# Patient Record
Sex: Female | Born: 2000 | Hispanic: Yes | Marital: Single | Smoking: Never smoker
Health system: Southern US, Community
[De-identification: ages and names within clinical notes are randomized; demographics above are authoritative.]

## PROBLEM LIST (undated history)

## (undated) ENCOUNTER — Ambulatory Visit: Admission: EM | Payer: Medicaid Other | Source: Home / Self Care

## (undated) HISTORY — PX: NO PAST SURGERIES: SHX2092

---
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
## 2023-07-05 ENCOUNTER — Ambulatory Visit (INDEPENDENT_AMBULATORY_CARE_PROVIDER_SITE_OTHER): Payer: Medicaid Other

## 2023-07-05 VITALS — Ht 62.0 in | Wt 188.0 lb

## 2023-07-05 DIAGNOSIS — Z369 Encounter for antenatal screening, unspecified: Secondary | ICD-10-CM

## 2023-07-05 DIAGNOSIS — Z3480 Encounter for supervision of other normal pregnancy, unspecified trimester: Secondary | ICD-10-CM

## 2023-07-05 DIAGNOSIS — Z3689 Encounter for other specified antenatal screening: Secondary | ICD-10-CM

## 2023-07-05 DIAGNOSIS — Z3490 Encounter for supervision of normal pregnancy, unspecified, unspecified trimester: Secondary | ICD-10-CM | POA: Insufficient documentation

## 2023-07-05 NOTE — Progress Notes (Signed)
New OB Intake  I connected with  Carolyn King on 07/05/23 at  1:15 PM EDT by telephone  and verified that I am speaking with the correct person using two identifiers. Nurse is located at [facility] and pt is located at home.  I explained I am completing New OB Intake today. We discussed her EDD of 02/05/2024 that is based on LMP of 05/01/2023. Pt is G1/P0. I reviewed her allergies, medications, Medical/Surgical/OB history, and appropriate screenings. There are cats in the home. Another family member changes the litter box. Based on history, this is a/an pregnancy uncomplicated .   Patient Active Problem List   Diagnosis Date Noted   Supervision of other normal pregnancy, antepartum 07/05/2023    Concerns addressed today None  Delivery Plans:  Plans to deliver at [facility].  Pt aware [facility] is the only hosp our providers deliver at.  Anatomy US Explained first scheduled [place] will be scheduled soon and an anatomy scan will be done at 20 weeks.  Labs Discussed genetic screening with patient. Patient declines genetic testing to be drawn at new OB visit. Discussed possible labs to be drawn at new OB appointment.  COVID Vaccine Patient has had COVID vaccine.   Social Determinants of Health Food Insecurity: denies food insecurity Transportation: Patient denies transportation needs.  First visit review I reviewed new OB appt with pt. I explained she will have ob bloodwork and pap smear/pelvic exam if indicated. Explained pt will be seen by an AOB provider at first visit; encounter routed to appropriate provider.   Loran Senters, [place] 07/05/2023  1:38 PM

## 2023-07-05 NOTE — Patient Instructions (Signed)
Second Trimester of Pregnancy  The second trimester of pregnancy is from week 13 through week 27. This is months 4 through 6 of pregnancy. The second trimester is often a time when you feel your best. Your body has adjusted to being pregnant, and you begin to feel better physically. During the second trimester: Morning sickness has lessened or stopped completely. You may have more energy. You may have an increase in appetite. The second trimester is also a time when the unborn baby (fetus) is growing rapidly. At the end of the sixth month, the fetus may be up to 12 inches long and weigh about 1 pounds. You will likely begin to feel the baby move (quickening) between 16 and 20 weeks of pregnancy. Body changes during your second trimester Your body continues to go through many changes during your second trimester. The changes vary and generally return to normal after the baby is born. Physical changes Your weight will continue to increase. You will notice your lower abdomen bulging out. You may begin to get stretch marks on your hips, abdomen, and breasts. Your breasts will continue to grow and to become tender. Dark spots or blotches (chloasma or mask of pregnancy) may develop on your face. A dark line from your belly button to the pubic area (linea nigra) may appear. You may have changes in your hair. These can include thickening of your hair, rapid growth, and changes in texture. Some people also have hair loss during or after pregnancy, or hair that feels dry or thin. Health changes You may develop headaches. You may have heartburn. You may develop constipation. You may develop hemorrhoids or swollen, bulging veins (varicose veins). Your gums may bleed and may be sensitive to brushing and flossing. You may urinate more often because the fetus is pressing on your bladder. You may have back pain. This is caused by: Weight gain. Pregnancy hormones that are relaxing the joints in your  pelvis. A shift in weight and the muscles that support your balance. Follow these instructions at home: Medicines Follow your health care provider's instructions regarding medicine use. Specific medicines may be either safe or unsafe to take during pregnancy. Do not take any medicines unless approved by your health care provider. Take a prenatal vitamin that contains at least 600 micrograms (mcg) of folic acid. Eating and drinking Eat a healthy diet that includes fresh fruits and vegetables, whole grains, good sources of protein such as meat, eggs, or tofu, and low-fat dairy products. Avoid raw meat and unpasteurized juice, milk, and cheese. These carry germs that can harm you and your baby. You may need to take these actions to prevent or treat constipation: Drink enough fluid to keep your urine pale yellow. Eat foods that are high in fiber, such as beans, whole grains, and fresh fruits and vegetables. Limit foods that are high in fat and processed sugars, such as fried or sweet foods. Activity Exercise only as directed by your health care provider. Most people can continue their usual exercise routine during pregnancy. Try to exercise for 30 minutes at least 5 days a week. Stop exercising if you develop contractions in your uterus. Stop exercising if you develop pain or cramping in the lower abdomen or lower back. Avoid exercising if it is very hot or humid or if you are at a high altitude. Avoid heavy lifting. If you choose to, you may have sex unless your health care provider tells you not to. Relieving pain and discomfort Wear a supportive   bra to prevent discomfort from breast tenderness. Take warm sitz baths to soothe any pain or discomfort caused by hemorrhoids. Use hemorrhoid cream if your health care provider approves. Rest with your legs raised (elevated) if you have leg cramps or low back pain. If you develop varicose veins: Wear support hose as told by your health care  provider. Elevate your feet for 15 minutes, 3-4 times a day. Limit salt in your diet. Safety Wear your seat belt at all times when driving or riding in a car. Talk with your health care provider if someone is verbally or physically abusive to you. Lifestyle Do not use hot tubs, steam rooms, or saunas. Do not douche. Do not use tampons or scented sanitary pads. Avoid cat litter boxes and soil used by cats. These carry germs that can cause birth defects in the baby and possibly loss of the fetus by miscarriage or stillbirth. Do not use herbal remedies, alcohol, illegal drugs, or medicines that are not approved by your health care provider. Chemicals in these products can harm your baby. Do not use any products that contain nicotine or tobacco, such as cigarettes, e-cigarettes, and chewing tobacco. If you need help quitting, ask your health care provider. General instructions During a routine prenatal visit, your health care provider will do a physical exam and other tests. He or she will also discuss your overall health. Keep all follow-up visits. This is important. Ask your health care provider for a referral to a local prenatal education class. Ask for help if you have counseling or nutritional needs during pregnancy. Your health care provider can offer advice or refer you to specialists for help with various needs. Where to find more information American Pregnancy Association: americanpregnancy.org American College of Obstetricians and Gynecologists: acog.org/en/Womens%20Health/Pregnancy Office on Women's Health: womenshealth.gov/pregnancy Contact a health care provider if you have: A headache that does not go away when you take medicine. Vision changes or you see spots in front of your eyes. Mild pelvic cramps, pelvic pressure, or nagging pain in the abdominal area. Persistent nausea, vomiting, or diarrhea. A bad-smelling vaginal discharge or foul-smelling urine. Pain when you  urinate. Sudden or extreme swelling of your face, hands, ankles, feet, or legs. A fever. Get help right away if you: Have fluid leaking from your vagina. Have spotting or bleeding from your vagina. Have severe abdominal cramping or pain. Have difficulty breathing. Have chest pain. Have fainting spells. Have not felt your baby move for the time period told by your health care provider. Have new or increased pain, swelling, or redness in an arm or leg. Summary The second trimester of pregnancy is from week 13 through week 27 (months 4 through 6). Do not use herbal remedies, alcohol, illegal drugs, or medicines that are not approved by your health care provider. Chemicals in these products can harm your baby. Exercise only as directed by your health care provider. Most people can continue their usual exercise routine during pregnancy. Keep all follow-up visits. This is important. This information is not intended to replace advice given to you by your health care provider. Make sure you discuss any questions you have with your health care provider. Document Revised: 04/06/2020 Document Reviewed: 02/11/2020 Elsevier Patient Education  2024 [employer] First Trimester of Pregnancy  The first trimester of pregnancy starts on the first day of your last menstrual period until the end of week 12. This is also called months 1 through 3 of pregnancy. Body changes during your first trimester Your   body goes through many changes during pregnancy. The changes usually return to normal after your baby is born. Physical changes You may gain or lose weight. Your breasts may grow larger and hurt. The area around your nipples may get darker. Dark spots or blotches may develop on your face. You may have changes in your hair. Health changes You may feel like you might vomit (nauseous), and you may vomit. You may have heartburn. You may have headaches. You may have trouble pooping (constipation). Your  gums may bleed. Other changes You may get tired easily. You may pee (urinate) more often. Your menstrual periods will stop. You may not feel hungry. You may want to eat certain kinds of food. You may have changes in your emotions from day to day. You may have more dreams. Follow these instructions at home: Medicines Take over-the-counter and prescription medicines only as told by your doctor. Some medicines are not safe during pregnancy. Take a prenatal vitamin that contains at least 600 micrograms (mcg) of folic acid. Eating and drinking Eat healthy meals that include: Fresh fruits and vegetables. Whole grains. Good sources of protein, such as meat, eggs, or tofu. Low-fat dairy products. Avoid raw meat and unpasteurized juice, milk, and cheese. If you feel like you may vomit, or you vomit: Eat 4 or 5 small meals a day instead of 3 large meals. Try eating a few soda crackers. Drink liquids between meals instead of during meals. You may need to take these actions to prevent or treat trouble pooping: Drink enough fluids to keep your pee (urine) pale yellow. Eat foods that are high in fiber. These include beans, whole grains, and fresh fruits and vegetables. Limit foods that are high in fat and sugar. These include fried or sweet foods. Activity Exercise only as told by your doctor. Most people can do their usual exercise routine during pregnancy. Stop exercising if you have cramps or pain in your lower belly (abdomen) or low back. Do not exercise if it is too hot or too humid, or if you are in a place of great height (high altitude). Avoid heavy lifting. If you choose to, you may have sex unless your doctor tells you not to. Relieving pain and discomfort Wear a good support bra if your breasts are sore. Rest with your legs raised (elevated) if you have leg cramps or low back pain. If you have bulging veins (varicose veins) in your legs: Wear support hose as told by your  doctor. Raise your feet for 15 minutes, 3-4 times a day. Limit salt in your food. Safety Wear your seat belt at all times when you are in a car. Talk with your doctor if someone is hurting you or yelling at you. Talk with your doctor if you are feeling sad or have thoughts of hurting yourself. Lifestyle Do not use hot tubs, steam rooms, or saunas. Do not douche. Do not use tampons or scented sanitary pads. Do not use herbal medicines, illegal drugs, or medicines that are not approved by your doctor. Do not drink alcohol. Do not smoke or use any products that contain nicotine or tobacco. If you need help quitting, ask your doctor. Avoid cat litter boxes and soil that is used by cats. These carry germs that can cause harm to the baby and can cause a loss of your baby by miscarriage or stillbirth. General instructions Keep all follow-up visits. This is important. Ask for help if you need counseling or if you need help with   nutrition. Your doctor can give you advice or tell you where to go for help. Visit your dentist. At home, brush your teeth with a soft toothbrush. Floss gently. Write down your questions. Take them to your prenatal visits. Where to find more information American Pregnancy Association: americanpregnancy.org American College of Obstetricians and Gynecologists: www.acog.org Office on Women's Health: womenshealth.gov/pregnancy Contact a doctor if: You are dizzy. You have a fever. You have mild cramps or pressure in your lower belly. You have a nagging pain in your belly area. You continue to feel like you may vomit, you vomit, or you have watery poop (diarrhea) for 24 hours or longer. You have a bad-smelling fluid coming from your vagina. You have pain when you pee. You are exposed to a disease that spreads from person to person, such as chickenpox, measles, Zika virus, HIV, or hepatitis. Get help right away if: You have spotting or bleeding from your vagina. You have  very bad belly cramping or pain. You have shortness of breath or chest pain. You have any kind of injury, such as from a fall or a car crash. You have new or increased pain, swelling, or redness in an arm or leg. Summary The first trimester of pregnancy starts on the first day of your last menstrual period until the end of week 12 (months 1 through 3). Eat 4 or 5 small meals a day instead of 3 large meals. Do not smoke or use any products that contain nicotine or tobacco. If you need help quitting, ask your doctor. Keep all follow-up visits. This information is not intended to replace advice given to you by your health care provider. Make sure you discuss any questions you have with your health care provider. Document Revised: 04/06/2020 Document Reviewed: 02/11/2020 Elsevier Patient Education  2024 [employer] Commonly Asked Questions During Pregnancy  Cats: A parasite can be excreted in cat feces.  To avoid exposure you need to have another person empty the little box.  If you must empty the litter box you will need to wear gloves.  Wash your hands after handling your cat.  This parasite can also be found in raw or undercooked meat so this should also be avoided.  Colds, Sore Throats, Flu: Please check your medication sheet to see what you can take for symptoms.  If your symptoms are unrelieved by these medications please call the office.  Dental Work: Most any dental work your dentist recommends is permitted.  X-rays should only be taken during the first trimester if absolutely necessary.  Your abdomen should be shielded with a lead apron during all x-rays.  Please notify your provider prior to receiving any x-rays.  Novocaine is fine; gas is not recommended.  If your dentist requires a note from us prior to dental work please call the office and we will provide one for you.  Exercise: Exercise is an important part of staying healthy during your pregnancy.  You may continue most exercises  you were accustomed to prior to pregnancy.  Later in your pregnancy you will most likely notice you have difficulty with activities requiring balance like riding a bicycle.  It is important that you listen to your body and avoid activities that put you at a higher risk of falling.  Adequate rest and staying well hydrated are a must!  If you have questions about the safety of specific activities ask your provider.    Exposure to Children with illness: Try to avoid obvious exposure; report any   symptoms to us when noted,  If you have chicken pos, red measles or mumps, you should be immune to these diseases.   Please do not take any vaccines while pregnant unless you have checked with your OB provider.  Fetal Movement: After 28 weeks we recommend you do "kick counts" twice daily.  Lie or sit down in a calm quiet environment and count your baby movements "kicks".  You should feel your baby at least 10 times per hour.  If you have not felt 10 kicks within the first hour get up, walk around and have something sweet to eat or drink then repeat for an additional hour.  If count remains less than 10 per hour notify your provider.  Fumigating: Follow your pest control agent's advice as to how long to stay out of your home.  Ventilate the area well before re-entering.  Hemorrhoids:   Most over-the-counter preparations can be used during pregnancy.  Check your medication to see what is safe to use.  It is important to use a stool softener or fiber in your diet and to drink lots of liquids.  If hemorrhoids seem to be getting worse please call the office.   Hot Tubs:  Hot tubs Jacuzzis and saunas are not recommended while pregnant.  These increase your internal body temperature and should be avoided.  Intercourse:  Sexual intercourse is safe during pregnancy as long as you are comfortable, unless otherwise advised by your provider.  Spotting may occur after intercourse; report any bright red bleeding that is heavier  than spotting.  Labor:  If you know that you are in labor, please go to the hospital.  If you are unsure, please call the office and let us help you decide what to do.  Lifting, straining, etc:  If your job requires heavy lifting or straining please check with your provider for any limitations.  Generally, you should not lift items heavier than that you can lift simply with your hands and arms (no back muscles)  Painting:  Paint fumes do not harm your pregnancy, but may make you ill and should be avoided if possible.  Latex or water based paints have less odor than oils.  Use adequate ventilation while painting.  Permanents & Hair Color:  Chemicals in hair dyes are not recommended as they cause increase hair dryness which can increase hair loss during pregnancy.  " Highlighting" and permanents are allowed.  Dye may be absorbed differently and permanents may not hold as well during pregnancy.  Sunbathing:  Use a sunscreen, as skin burns easily during pregnancy.  Drink plenty of fluids; avoid over heating.  Tanning Beds:  Because their possible side effects are still unknown, tanning beds are not recommended.  Ultrasound Scans:  Routine ultrasounds are performed at approximately 20 weeks.  You will be able to see your baby's general anatomy an if you would like to know the gender this can usually be determined as well.  If it is questionable when you conceived you may also receive an ultrasound early in your pregnancy for dating purposes.  Otherwise ultrasound exams are not routinely performed unless there is a medical necessity.  Although you can request a scan we ask that you pay for it when conducted because insurance does not cover " patient request" scans.  Work: If your pregnancy proceeds without complications you may work until your due date, unless your physician or employer advises otherwise.  Round Ligament Pain/Pelvic Discomfort:  Sharp, shooting pains not associated   with bleeding are  fairly common, usually occurring in the second trimester of pregnancy.  They tend to be worse when standing up or when you remain standing for long periods of time.  These are the result of pressure of certain pelvic ligaments called "round ligaments".  Rest, Tylenol and heat seem to be the most effective relief.  As the womb and fetus grow, they rise out of the pelvis and the discomfort improves.  Please notify the office if your pain seems different than that described.  It may represent a more serious condition.  Common Medications Safe in Pregnancy  Acne:      Constipation:  Benzoyl Peroxide     Colace  Clindamycin      Dulcolax Suppository  Topica Erythromycin     Fibercon  Salicylic Acid      Metamucil         Miralax AVOID:        Senakot   Accutane    Cough:  Retin-A       Cough Drops  Tetracycline      Phenergan w/ Codeine if Rx  Minocycline      Robitussin (Plain & DM)  Antibiotics:     Crabs/Lice:  Ceclor       RID  Cephalosporins    AVOID:  E-Mycins      Kwell  Keflex  Macrobid/Macrodantin   Diarrhea:  Penicillin      Kao-Pectate  Zithromax      Imodium AD         PUSH FLUIDS AVOID:       Cipro     Fever:  Tetracycline      Tylenol (Regular or Extra  Minocycline       Strength)  Levaquin      Extra Strength-Do not          Exceed 8 tabs/24 hrs Caffeine:        <200mg/day (equiv. To 1 cup of coffee or  approx. 3 12 oz sodas)         Gas: Cold/Hayfever:       Gas-X  Benadryl      Mylicon  Claritin       Phazyme  **Claritin-D        Chlor-Trimeton    Headaches:  Dimetapp      ASA-Free Excedrin  Drixoral-Non-Drowsy     Cold Compress  Mucinex (Guaifenasin)     Tylenol (Regular or Extra  Sudafed/Sudafed-12 Hour     Strength)  **Sudafed PE Pseudoephedrine   Tylenol Cold & Sinus     Vicks Vapor Rub  Zyrtec  **AVOID if Problems With Blood Pressure         Heartburn: Avoid lying down for at least 1 hour after meals  Aciphex      Maalox     Rash:  Milk of  Magnesia     Benadryl    Mylanta       1% Hydrocortisone Cream  Pepcid  Pepcid Complete   Sleep Aids:  Prevacid      Ambien   Prilosec       Benadryl  Rolaids       Chamomile Tea  Tums (Limit 4/day)     Unisom         Tylenol PM         Warm milk-add vanilla or  Hemorrhoids:       Sugar for taste  Anusol/Anusol H.C.  (RX: Analapram 2.5%)  Sugar Substitutes:    Hydrocortisone OTC     Ok in moderation  Preparation H      Tucks        Vaseline lotion applied to tissue with wiping    Herpes:     Throat:  Acyclovir      Oragel  Famvir  Valtrex     Vaccines:         Flu Shot Leg Cramps:       *Gardasil  Benadryl      Hepatitis A         Hepatitis B Nasal Spray:       Pneumovax  Saline Nasal Spray     Polio Booster         Tetanus Nausea:       Tuberculosis test or PPD  Vitamin B6 25 mg TID   AVOID:    Dramamine      *Gardasil  Emetrol       Live Poliovirus  Ginger Root 250 mg QID    MMR (measles, mumps &  High Complex Carbs @ Bedtime    rebella)  Sea Bands-Accupressure    Varicella (Chickenpox)  Unisom 1/2 tab TID     *No known complications           If received before Pain:         Known pregnancy;   Darvocet       Resume series after  Lortab        Delivery  Percocet    Yeast:   Tramadol      Femstat  Tylenol 3      Gyne-lotrimin  Ultram       Monistat  Vicodin           MISC:         All Sunscreens           Hair Coloring/highlights          Insect Repellant's          (Including DEET)         Mystic Tans  

## 2023-07-25 ENCOUNTER — Other Ambulatory Visit: Payer: Self-pay | Admitting: Certified Nurse Midwife

## 2023-07-25 ENCOUNTER — Ambulatory Visit
Admission: RE | Admit: 2023-07-25 | Discharge: 2023-07-25 | Disposition: A | Discharge status: Home / Self Care | Payer: Medicaid Other | Source: Ambulatory Visit | Attending: Certified Nurse Midwife | Admitting: Certified Nurse Midwife

## 2023-07-25 DIAGNOSIS — Z3687 Encounter for antenatal screening for uncertain dates: Secondary | ICD-10-CM | POA: Diagnosis present

## 2023-07-25 DIAGNOSIS — Z3689 Encounter for other specified antenatal screening: Secondary | ICD-10-CM | POA: Diagnosis not present

## 2023-07-25 DIAGNOSIS — Z369 Encounter for antenatal screening, unspecified: Secondary | ICD-10-CM

## 2023-07-25 DIAGNOSIS — Z3480 Encounter for supervision of other normal pregnancy, unspecified trimester: Secondary | ICD-10-CM

## 2023-07-25 DIAGNOSIS — Z3A12 12 weeks gestation of pregnancy: Secondary | ICD-10-CM | POA: Diagnosis not present

## 2023-08-07 ENCOUNTER — Ambulatory Visit (INDEPENDENT_AMBULATORY_CARE_PROVIDER_SITE_OTHER): Payer: Medicaid Other | Admitting: Certified Nurse Midwife

## 2023-08-07 ENCOUNTER — Encounter: Payer: Self-pay | Admitting: Certified Nurse Midwife

## 2023-08-07 ENCOUNTER — Other Ambulatory Visit (HOSPITAL_COMMUNITY)
Admission: RE | Admit: 2023-08-07 | Discharge: 2023-08-07 | Disposition: A | Discharge status: Home / Self Care | Payer: Medicaid Other | Source: Ambulatory Visit | Attending: Certified Nurse Midwife | Admitting: Certified Nurse Midwife

## 2023-08-07 VITALS — BP 105/69 | HR 81 | Wt 184.4 lb

## 2023-08-07 DIAGNOSIS — Z3A14 14 weeks gestation of pregnancy: Secondary | ICD-10-CM

## 2023-08-07 DIAGNOSIS — Z3482 Encounter for supervision of other normal pregnancy, second trimester: Secondary | ICD-10-CM

## 2023-08-07 DIAGNOSIS — Z113 Encounter for screening for infections with a predominantly sexual mode of transmission: Secondary | ICD-10-CM | POA: Diagnosis present

## 2023-08-07 MED ORDER — ASPIRIN 81 MG PO TBEC: 162.0000 mg | DELAYED_RELEASE_TABLET | Freq: Every day | ORAL | 12 refills | Status: DC

## 2023-08-07 NOTE — Patient Instructions (Signed)
Prenatal Care Prenatal care is health care during pregnancy. It helps you and your unborn baby (fetus) stay as healthy as possible. Prenatal care may be provided by a midwife, a family practice doctor, a Dispensing optician (nurse practitioner or physician assistant), or a childbirth and pregnancy doctor (obstetrician). How does this affect me? During pregnancy, you will be closely monitored for any new conditions that might develop. To lower your risk of pregnancy complications, you and your health care provider will talk about any underlying conditions you have. How does this affect my baby? Early and consistent prenatal care increases the chance that your baby will be healthy during pregnancy. Prenatal care lowers the risk that your baby will be: Born early (prematurely). Smaller than expected at birth (small for gestational age). What can I expect at the first prenatal care visit? Your first prenatal care visit will likely be the longest. You should schedule your first prenatal care visit as soon as you know that you are pregnant. Your first visit is a good time to talk about any questions or concerns you have about pregnancy. Medical history At your visit, you and your health care provider will talk about your medical history, including: Any past pregnancies. Your family's medical history. Medical history of the baby's father. Any long-term (chronic) health conditions you have and how you manage them. Any surgeries or procedures you have had. Any current over-the-counter or prescription medicines, herbs, or supplements that you are taking. Other factors that could pose a risk to your baby, including: Exposure to harmful chemicals or radiation at work or at home. Any substance use, including tobacco, alcohol, and drug use. Your home setting and your stress levels, including: Exposure to abuse or violence. Household financial strain. Your daily health habits, including diet and  exercise. Tests and screenings Your health care provider will: Measure your weight, height, and blood pressure. Do a physical exam, including a pelvic and breast exam. Perform blood tests and urine tests to check for: Urinary tract infection. Sexually transmitted infections (STIs). Low iron levels in your blood (anemia). Blood type and certain proteins on red blood cells (Rh antibodies). Infections and immunity to viruses, such as hepatitis B and rubella. HIV (human immunodeficiency virus). Discuss your options for genetic screening. Tips about staying healthy Your health care provider will also give you information about how to keep yourself and your baby healthy, including: Nutrition and taking vitamins. Physical activity. How to manage pregnancy symptoms such as nausea and vomiting (morning sickness). Infections and substances that may be harmful to your baby and how to avoid them. Food safety. Dental care. Working. Travel. Warning signs to watch for and when to call your health care provider. How often will I have prenatal care visits? After your first prenatal care visit, you will have regular visits throughout your pregnancy. The visit schedule is often as follows: Up to week 28 of pregnancy: once every 4 weeks. 28-36 weeks: once every 2 weeks. After 36 weeks: every week until delivery. Some women may have visits more or less often depending on any underlying health conditions and the health of the baby. Keep all follow-up and prenatal care visits. This is important. What happens during routine prenatal care visits? Your health care provider will: Measure your weight and blood pressure. Check for fetal heart sounds. Measure the height of your uterus in your abdomen (fundal height). This may be measured starting around week 20 of pregnancy. Check the position of your baby inside your uterus. Ask questions  about your diet, sleeping patterns, and whether you can feel the baby  move. Review warning signs to watch for and signs of labor. Ask about any pregnancy symptoms you are having and how you are dealing with them. Symptoms may include: Headaches. Nausea and vomiting. Vaginal discharge. Swelling. Fatigue. Constipation. Changes in your vision. Feeling persistently sad or anxious. Any discomfort, including back or pelvic pain. Bleeding or spotting. Make a list of questions to ask your health care provider at your routine visits. What tests might I have during prenatal care visits? You may have blood, urine, and imaging tests throughout your pregnancy, such as: Urine tests to check for glucose, protein, or signs of infection. Glucose tests to check for a form of diabetes that can develop during pregnancy (gestational diabetes mellitus). This is usually done around week 24 of pregnancy. Ultrasounds to check your baby's growth and development, to check for birth defects, and to check your baby's well-being. These can also help to decide when you should deliver your baby. A test to check for group B strep (GBS) infection. This is usually done around week 36 of pregnancy. Genetic testing. This may include blood, fluid, or tissue sampling, or imaging tests, such as an ultrasound. Some genetic tests are done during the first trimester and some are done during the second trimester. What else can I expect during prenatal care visits? Your health care provider may recommend getting certain vaccines during pregnancy. These may include: A yearly flu shot (annual influenza vaccine). This is especially important if you will be pregnant during flu season. Tdap (tetanus, diphtheria, pertussis) vaccine. Getting this vaccine during pregnancy can protect your baby from whooping cough (pertussis) after birth. This vaccine may be recommended between weeks 27 and 36 of pregnancy. A COVID-19 vaccine. Later in your pregnancy, your health care provider may give you information  about: Childbirth and breastfeeding classes. Choosing a health care provider for your baby. Umbilical cord banking. Breastfeeding. Birth control after your baby is born. The hospital labor and delivery unit and how to set up a tour. Registering at the hospital before you go into labor. Where to find more information Office on Women's Health: TravelLesson.ca American Pregnancy Association: americanpregnancy.org March of Dimes: marchofdimes.org Summary Prenatal care helps you and your baby stay as healthy as possible during pregnancy. Your first prenatal care visit will most likely be the longest. You will have visits and tests throughout your pregnancy to monitor your health and your baby's health. Bring a list of questions to your visits to ask your health care provider. Make sure to keep all follow-up and prenatal care visits. This information is not intended to replace advice given to you by your health care provider. Make sure you discuss any questions you have with your health care provider. Document Revised: 08/11/2020 Document Reviewed: 08/11/2020 Elsevier Patient Education  2024 [employer].

## 2023-08-07 NOTE — Progress Notes (Signed)
NEW OB HISTORY AND PHYSICAL  SUBJECTIVE:       Carolyn King is a 22 y.o. G1P0 female, Patient's last menstrual period was 05/01/2023 (exact date)., Estimated Date of Delivery: 02/05/24, [redacted]w[redacted]d, presents today for establishment of Prenatal Care. She has no unusual complaints   Body mass index is 33.73 kg/m. , Hispanic , nulliparity : Asprin 162 mg ordered    Relationship: Married  Living: with spouse and step child Work : Production assistant, radio at [employer]  Exercise: lots of walking at work  Alcohol/drugs/smoking/vaping:  denies use    Gynecologic History Patient's last menstrual period was 05/01/2023 (exact date). Normal Contraception: none Last Pap: n/a .   Obstetric History OB History  Gravida Para Term Preterm AB Living  1            SAB IAB Ectopic Multiple Live Births               # Outcome Date GA Lbr Len/2nd Weight Sex Type Anes PTL Lv  1 Current             No past medical history on file.  No past surgical history on file.  Current Outpatient Medications on File Prior to Visit  Medication Sig Dispense Refill   Prenatal 28-0.8 MG TABS Take 1 tablet by mouth every morning.     No current facility-administered medications on file prior to visit.    No Known Allergies  Social History   Socioeconomic History   Marital status: Single    Spouse name: Not on file   Number of children: 0   Years of education: 12   Highest education level: Not on file  Occupational History   Occupation: works at a clinic  Tobacco Use   Smoking status: Never   Smokeless tobacco: Never  Vaping Use   Vaping status: Never Used  Substance and Sexual Activity   Alcohol use: Never   Drug use: Never   Sexual activity: Yes    Partners: Male    Birth control/protection: None  Other Topics Concern   Not on file  Social History Narrative   Not on file   Social Determinants of Health   Financial Resource Strain: Low Risk  (07/05/2023)   Overall Financial Resource Strain (CARDIA)     Difficulty of Paying Living Expenses: Not hard at all  Food Insecurity: No Food Insecurity (07/05/2023)   Hunger Vital Sign    Worried About Running Out of Food in the Last Year: Never true    Ran Out of Food in the Last Year: Never true  Transportation Needs: No Transportation Needs (07/05/2023)   PRAPARE - Administrator, Civil Service (Medical): No    Lack of Transportation (Non-Medical): No  Physical Activity: Insufficiently Active (07/05/2023)   Exercise Vital Sign    Days of Exercise per Week: 1 day    Minutes of Exercise per Session: 30 min  Stress: No Stress Concern Present (07/05/2023)   [employer] of Occupational Health - Occupational Stress Questionnaire    Feeling of Stress : Not at all  Social Connections: Unknown (07/05/2023)   Social Connection and Isolation Panel [NHANES]    Frequency of Communication with Friends and Family: More than three times a week    Frequency of Social Gatherings with Friends and Family: More than three times a week    Attends Religious Services: Never    Database administrator or Organizations: No    Attends Banker  Meetings: Never    Marital Status: Not on file  Intimate Partner Violence: Not At Risk (07/05/2023)   Humiliation, Afraid, Rape, and Kick questionnaire    Fear of Current or Ex-Partner: No    Emotionally Abused: No    Physically Abused: No    Sexually Abused: No    Family History  Problem Relation Age of Onset   Healthy Mother    Healthy Father    Healthy Sister    Healthy Brother    Diabetes Maternal Grandmother    Healthy Maternal Grandfather    Healthy Paternal Grandmother    Healthy Paternal Grandfather     The following portions of the patient's history were reviewed and updated as appropriate: allergies, current medications, past OB history, past medical history, past surgical history, past family history, past social history, and problem list.    OBJECTIVE: Initial Physical Exam  (New OB)  GENERAL APPEARANCE: alert, well appearing, in no apparent distress, oriented to person, place and time, overweight HEAD: normocephalic, atraumatic MOUTH: mucous membranes moist, pharynx normal without lesions THYROID: no thyromegaly or masses present BREASTS: no masses noted, no significant tenderness, no palpable axillary nodes, no skin changes LUNGS: clear to auscultation, no wheezes, rales or rhonchi, symmetric air entry HEART: regular rate and rhythm, no murmurs ABDOMEN: soft, nontender, nondistended, no abnormal masses, no epigastric pain, obese, and FHT present EXTREMITIES: no redness or tenderness in the calves or thighs, no edema, no limitation in range of motion, intact peripheral pulses SKIN: normal coloration and turgor, no rashes LYMPH NODES: no adenopathy palpable NEUROLOGIC: alert, oriented, normal speech, no focal findings or movement disorder noted  PELVIC EXAM deferred pt declines , pap not indicated . Pt self swab.   ASSESSMENT: Normal pregnancy  PLAN: Prenatal care See ordersNew OB counseling: The patient has been given an overview regarding routine prenatal care. Recommendations regarding diet, weight gain  , and exercise in pregnancy were given. Prenatal testing, optional genetic testing, carrier screening, and ultrasound use in pregnancy were reviewed.  Benefits of Breast Feeding were discussed. The patient is encouraged to consider nursing her baby post partum.   Doreene Burke, CNM

## 2023-08-07 NOTE — Addendum Note (Signed)
Addended by: Fonda Kinder on: 08/07/2023 03:50 PM   Modules accepted: Orders

## 2023-08-08 LAB — URINALYSIS, ROUTINE W REFLEX MICROSCOPIC
Bilirubin, UA: NEGATIVE
Glucose, UA: NEGATIVE
Leukocytes,UA: NEGATIVE
Nitrite, UA: NEGATIVE
RBC, UA: NEGATIVE
Specific Gravity, UA: >=1.03 — AB (ref 1.005–1.030)
Urobilinogen, Ur: 1 mg/dL (ref 0.2–1.0)
pH, UA: 6 (ref 5.0–7.5)

## 2023-08-08 LAB — CERVICOVAGINAL ANCILLARY ONLY
Chlamydia: NEGATIVE
Comment: NEGATIVE
Comment: NORMAL
Neisseria Gonorrhea: NEGATIVE

## 2023-08-08 LAB — MICROSCOPIC EXAMINATION
Casts: NONE SEEN /lpf
Epithelial Cells (non renal): >10 /hpf — AB (ref 0–10)
RBC, Urine: NONE SEEN /hpf (ref 0–2)

## 2023-08-08 LAB — CBC/D/PLT+RPR+RH+ABO+RUBIGG...
Antibody Screen: NEGATIVE
Basophils Absolute: 0.1 10*3/uL (ref 0.0–0.2)
Basos: 1 %
EOS (ABSOLUTE): 0.7 10*3/uL — ABNORMAL HIGH (ref 0.0–0.4)
Eos: 6 %
HCV Ab: NONREACTIVE
HIV Screen 4th Generation wRfx: NONREACTIVE
Hematocrit: 35.6 % (ref 34.0–46.6)
Hemoglobin: 11.5 g/dL (ref 11.1–15.9)
Hepatitis B Surface Ag: NEGATIVE
Immature Grans (Abs): 0 10*3/uL (ref 0.0–0.1)
Immature Granulocytes: 0 %
Lymphocytes Absolute: 2.1 10*3/uL (ref 0.7–3.1)
Lymphs: 19 %
MCH: 29.6 pg (ref 26.6–33.0)
MCHC: 32.3 g/dL (ref 31.5–35.7)
MCV: 92 fL (ref 79–97)
Monocytes Absolute: 0.6 10*3/uL (ref 0.1–0.9)
Monocytes: 5 %
Neutrophils Absolute: 7.4 10*3/uL — ABNORMAL HIGH (ref 1.4–7.0)
Neutrophils: 69 %
Platelets: 338 10*3/uL (ref 150–450)
RBC: 3.88 x10E6/uL (ref 3.77–5.28)
RDW: 12.5 % (ref 11.7–15.4)
RPR Ser Ql: NONREACTIVE
Rh Factor: POSITIVE
Rubella Antibodies, IGG: 8.03 index (ref >0.99)
Varicella zoster IgG: REACTIVE
WBC: 10.8 10*3/uL (ref 3.4–10.8)

## 2023-08-08 LAB — HCV INTERPRETATION

## 2023-08-09 LAB — MONITOR DRUG PROFILE 14(MW)
Amphetamine Scrn, Ur: NEGATIVE ng/mL
BARBITURATE SCREEN URINE: NEGATIVE ng/mL
BENZODIAZEPINE SCREEN, URINE: NEGATIVE ng/mL
Buprenorphine, Urine: NEGATIVE ng/mL
CANNABINOIDS UR QL SCN: NEGATIVE ng/mL
Cocaine (Metab) Scrn, Ur: NEGATIVE ng/mL
Creatinine(Crt), U: 327.2 mg/dL — ABNORMAL HIGH (ref 20.0–300.0)
Fentanyl, Urine: NEGATIVE pg/mL
Meperidine Screen, Urine: NEGATIVE ng/mL
Methadone Screen, Urine: NEGATIVE ng/mL
OXYCODONE+OXYMORPHONE UR QL SCN: NEGATIVE ng/mL
Opiate Scrn, Ur: NEGATIVE ng/mL
Ph of Urine: 5.8 (ref 4.5–8.9)
Phencyclidine Qn, Ur: NEGATIVE ng/mL
Propoxyphene Scrn, Ur: NEGATIVE ng/mL
SPECIFIC GRAVITY: 1.021
Tramadol Screen, Urine: NEGATIVE ng/mL

## 2023-08-09 LAB — NICOTINE SCREEN, URINE: Cotinine Ql Scrn, Ur: NEGATIVE ng/mL

## 2023-08-12 ENCOUNTER — Telehealth: Payer: Self-pay

## 2023-08-12 LAB — MATERNIT 21 PLUS CORE, BLOOD
Fetal Fraction: 19
Result (T21): NEGATIVE
Trisomy 13 (Patau syndrome): NEGATIVE
Trisomy 18 (Edwards syndrome): NEGATIVE
Trisomy 21 (Down syndrome): NEGATIVE

## 2023-08-12 NOTE — Telephone Encounter (Signed)
Carolyn King called triage wanting to know the gender of her baby, I advised her it was a Female

## 2023-09-04 ENCOUNTER — Encounter: Payer: Medicaid Other | Admitting: Obstetrics and Gynecology

## 2023-09-05 ENCOUNTER — Ambulatory Visit (INDEPENDENT_AMBULATORY_CARE_PROVIDER_SITE_OTHER): Payer: Medicaid Other

## 2023-09-05 VITALS — BP 100/66 | HR 64 | Wt 188.0 lb

## 2023-09-05 DIAGNOSIS — Z3A18 18 weeks gestation of pregnancy: Secondary | ICD-10-CM

## 2023-09-05 DIAGNOSIS — Z3480 Encounter for supervision of other normal pregnancy, unspecified trimester: Secondary | ICD-10-CM

## 2023-09-05 NOTE — Assessment & Plan Note (Addendum)
-   Declined flu vaccine.  - Discussed need to schedule anatomy US in next 1-2 weeks. - Reviewed low suspicion for amniotic fluid given small gush and no ongoing leakage. Reviewed precautions for when to go to L&D triage.  - Reviewed red flag warning signs anticipatory guidance for upcoming prenatal care.

## 2023-09-05 NOTE — Progress Notes (Signed)
    Return Prenatal Note   Assessment/Plan   Plan  22 y.o. G1P0 at [redacted]w[redacted]d presents for follow-up OB visit. Reviewed prenatal record including previous visit note.  Supervision of other normal pregnancy, antepartum - Declined flu vaccine.  - Discussed need to schedule anatomy US in next 1-2 weeks. - Reviewed low suspicion for amniotic fluid given small gush and no ongoing leakage. Reviewed precautions for when to go to L&D triage.  - Reviewed red flag warning signs anticipatory guidance for upcoming prenatal care.    No orders of the defined types were placed in this encounter.  Return in about 4 weeks (around 10/03/2023) for ROB.   Future Appointments  Date Time Provider Department Center  10/03/2023 11:15 AM Doreene Burke, CNM [facility] None    For next visit:  continue with routine prenatal care     Subjective   22 y.o. G1P0 at [redacted]w[redacted]d presents for this follow-up prenatal visit.  Patient had small gush of clear fluid about two days ago but has not had any additional fluid leaking since that time. Unsure if it was urine although she didn't feel like she needed to pee. Patient reports: Movement: Present Contractions: Not present  Objective   Flow sheet Vitals: Pulse Rate: 64 BP: 100/66 Fundal Height: 18 cm Fetal Heart Rate (bpm): 145 Total weight gain: 10 lb (4.536 kg)  General Appearance  No acute distress, well appearing, and well nourished Pulmonary   Normal work of breathing Neurologic   Alert and oriented to person, place, and time Psychiatric   Mood and affect within normal limits  Carolyn King, CNM  09/04/2410:31 AM

## 2023-09-18 ENCOUNTER — Ambulatory Visit
Admission: RE | Admit: 2023-09-18 | Discharge: 2023-09-18 | Disposition: A | Discharge status: Home / Self Care | Payer: Medicaid Other | Source: Ambulatory Visit | Attending: Certified Nurse Midwife | Admitting: Certified Nurse Midwife

## 2023-09-18 DIAGNOSIS — Z3689 Encounter for other specified antenatal screening: Secondary | ICD-10-CM | POA: Diagnosis present

## 2023-09-18 DIAGNOSIS — Z3A19 19 weeks gestation of pregnancy: Secondary | ICD-10-CM | POA: Insufficient documentation

## 2023-09-18 DIAGNOSIS — Z3A14 14 weeks gestation of pregnancy: Secondary | ICD-10-CM

## 2023-09-30 ENCOUNTER — Other Ambulatory Visit: Payer: Self-pay | Admitting: Certified Nurse Midwife

## 2023-09-30 DIAGNOSIS — Z3A21 21 weeks gestation of pregnancy: Secondary | ICD-10-CM

## 2023-10-02 NOTE — Progress Notes (Unsigned)
       Routine Prenatal Care Visit  Subjective  Carolyn King is a 22 y.o. G1P0 at [redacted]w[redacted]d being seen today for ongoing prenatal care.  She is currently monitored for the following issues for this {Blank single:19197::"high-risk","low-risk"} pregnancy and has Supervision of other normal pregnancy, antepartum on their problem list.  ----------------------------------------------------------------------------------- Patient reports {sx:14538}.    .  .   Pincus Large Fluid {Actions; denies/reports/admits to:19208}.  ----------------------------------------------------------------------------------- The following portions of the patient's history were reviewed and updated as appropriate: allergies, current medications, past family history, past medical history, past social history, past surgical history and problem list. Problem list updated.  Objective  Last menstrual period 05/01/2023. Pregravid weight 178 lb (80.7 kg) Total Weight Gain 10 lb (4.536 kg) Urinalysis: Urine Protein    Urine Glucose    Fetal Status:           General:  Alert, oriented and cooperative. Patient is in no acute distress.  Skin: Skin is warm and dry. No rash noted.   Cardiovascular: Normal heart rate noted  Respiratory: Normal respiratory effort, no problems with respiration noted  Abdomen: Soft, gravid, appropriate for gestational age.       Pelvic:  {Blank single:19197::"Cervical exam performed","Cervical exam deferred"}        Extremities: Normal range of motion.     Mental Status: Normal mood and affect. Normal behavior. Normal judgment and thought content.   Assessment   22 y.o. G1P0 at [redacted]w[redacted]d by  02/05/2024, by Last Menstrual Period presenting for {Blank single:19197::"routine","work-in"} prenatal visit  Plan   first Problems (from 07/05/23 to present)     Problem Noted Resolved   Supervision of other normal pregnancy, antepartum 07/05/2023 by Loran Senters, CMA No   Overview Addendum 09/05/2023 11:29  AM by Burney Gauze, CNM     Clinical Staff Provider  Office Location  [facility] Dating  9/12, EDD 3/25  Language  English Anatomy US    Flu Vaccine  declined Genetic Screen  NIPS: low risk, XX  TDaP vaccine   offer Hgb A1C or  GTT Early : Third trimester :   Covid One dose   LAB RESULTS   Rhogam  O/Positive/-- (09/25 1532)  Blood Type O/Positive/-- (09/25 1532)   Feeding Plan breast Antibody Negative (09/25 1532)  Contraception undecided Rubella 8.03 (09/25 1532)  Circumcision undecided RPR Non Reactive (09/25 1532)   Pediatrician  Undecided  HBsAg Negative (09/25 1532)   Support Person Rogelio HIV Non Reactive (09/25 1532)  Prenatal Classes no Varicella Reactive (09/25 1532)    GBS  (For PCN allergy, check sensitivities)   BTL Consent  Hep C Non Reactive (09/25 1532)   VBAC Consent  Pap No results found for: "DIAGPAP"    Hgb Electro      CF      SMA                    {Blank single:19197::"Term","Preterm"} labor symptoms and general obstetric precautions including but not limited to vaginal bleeding, contractions, leaking of fluid and fetal movement were reviewed in detail with the patient. Please refer to After Visit Summary for other counseling recommendations.   No follow-ups on file.  @MYSIGNATURE @

## 2023-10-03 ENCOUNTER — Encounter: Payer: Self-pay | Admitting: Certified Nurse Midwife

## 2023-10-03 ENCOUNTER — Ambulatory Visit (INDEPENDENT_AMBULATORY_CARE_PROVIDER_SITE_OTHER): Payer: Medicaid Other | Admitting: Certified Nurse Midwife

## 2023-10-03 VITALS — BP 99/65 | HR 77 | Wt 190.0 lb

## 2023-10-03 DIAGNOSIS — Z3402 Encounter for supervision of normal first pregnancy, second trimester: Secondary | ICD-10-CM

## 2023-10-03 DIAGNOSIS — Z3A22 22 weeks gestation of pregnancy: Secondary | ICD-10-CM

## 2023-10-03 LAB — POCT URINALYSIS DIPSTICK OB
Bilirubin, UA: NEGATIVE
Blood, UA: NEGATIVE
Glucose, UA: NEGATIVE
Ketones, UA: NEGATIVE
Leukocytes, UA: NEGATIVE
Nitrite, UA: NEGATIVE
POC,PROTEIN,UA: NEGATIVE
Spec Grav, UA: 1.015 (ref 1.010–1.025)
Urobilinogen, UA: 0.2 U/dL
pH, UA: 7 (ref 5.0–8.0)

## 2023-10-03 NOTE — Patient Instructions (Signed)
Round Ligament Pain  The round ligaments are a pair of cord-like tissues that help support the uterus. They can become a source of pain during pregnancy as the ligaments soften and stretch as the baby grows. The pain usually begins in the second trimester (13-28 weeks) of pregnancy, and should only last for a few seconds when it occurs. However, the pain can come and go until the baby is delivered. The pain does not cause harm to the baby. Round ligament pain is usually a short, sharp, and pinching pain, but it can also be a dull, lingering, and aching pain. The pain is felt in the lower side of the abdomen or in the groin. It usually starts deep in the groin and moves up to the outside of the hip area. The pain may happen when you: Suddenly change position, such as quickly going from a sitting to standing position. Do physical activity. Cough or sneeze. Follow these instructions at home: Managing pain  When the pain starts, relax. Then, try any of these methods to help with the pain: Sit down. Flex your knees up to your abdomen. Lie on your side with one pillow under your abdomen and another pillow between your legs. Sit in a warm bath for 15-20 minutes or until the pain goes away. General instructions Watch your condition for any changes. Move slowly when you sit down or stand up. Stop or reduce your physical activities if they cause pain. Avoid long walks if they cause pain. Take over-the-counter and prescription medicines only as told by your health care provider. Keep all follow-up visits. This is important. Contact a health care provider if: Your pain does not go away with treatment. You feel pain in your back that you did not have before. Your medicine is not helping. You have a fever or chills. You have nausea or vomiting. You have diarrhea. You have pain when you urinate. Get help right away if: You have pain that is a rhythmic, cramping pain similar to labor pains. Labor  pains are usually 2 minutes apart, last for about 1 minute, and involve a bearing down feeling or pressure in your pelvis. You have vaginal bleeding. These symptoms may represent a serious problem that is an emergency. Do not wait to see if the symptoms will go away. Get medical help right away. Call your local emergency services (911 in the [place] Do not drive yourself to the hospital. Summary Round ligament pain is felt in the lower abdomen or groin. This pain usually begins in the second trimester (13-28 weeks) and should only last for a few seconds when it occurs. You may notice the pain when you suddenly change position, when you cough or sneeze, or during physical activity. Relaxing, flexing your knees to your abdomen, lying on one side, or taking a warm bath may help to get rid of the pain. Contact your health care provider if the pain does not go away. This information is not intended to replace advice given to you by your health care provider. Make sure you discuss any questions you have with your health care provider. Document Revised: 01/11/2021 Document Reviewed: 01/11/2021 Elsevier Patient Education  2024 Elsevier Inc.  

## 2023-10-03 NOTE — Progress Notes (Signed)
ROB doing well, feeling good movement. Pt has completion of anatomy scan scheduled for 12/6. She denies any concerns today. Follow up 24 wks for ROB.   Doreene Burke, CNM

## 2023-10-18 ENCOUNTER — Ambulatory Visit (INDEPENDENT_AMBULATORY_CARE_PROVIDER_SITE_OTHER): Payer: Medicaid Other

## 2023-10-18 DIAGNOSIS — Z3A21 21 weeks gestation of pregnancy: Secondary | ICD-10-CM

## 2023-10-18 DIAGNOSIS — Z362 Encounter for other antenatal screening follow-up: Secondary | ICD-10-CM | POA: Diagnosis not present

## 2023-10-24 ENCOUNTER — Ambulatory Visit (INDEPENDENT_AMBULATORY_CARE_PROVIDER_SITE_OTHER): Payer: Medicaid Other | Admitting: Obstetrics

## 2023-10-24 VITALS — BP 102/66 | HR 81 | Wt 198.0 lb

## 2023-10-24 DIAGNOSIS — Z3480 Encounter for supervision of other normal pregnancy, unspecified trimester: Secondary | ICD-10-CM

## 2023-10-24 DIAGNOSIS — Z3A25 25 weeks gestation of pregnancy: Secondary | ICD-10-CM

## 2023-10-24 NOTE — Progress Notes (Signed)
    Return Prenatal Note   Assessment/Plan   Plan  22 y.o. G1P0 at [redacted]w[redacted]d presents for follow-up OB visit. Reviewed prenatal record including previous visit note.  Supervision of other normal pregnancy, antepartum -Encouraged healthy eating habits and physical activity -Anticipatory guidance about the 3rd trimester and fetal development -Discussed 28-week labs for next visit -Reviewed s/s of PTL and when to seek medical attention -Link for CBE/hospital tour sent via MyChart   No orders of the defined types were placed in this encounter.  Return in about 4 weeks (around 11/21/2023).   Future Appointments  Date Time Provider Department Center  11/21/2023  9:00 AM [facility] None  11/21/2023  9:35 AM Dominic, Courtney Heys, CNM [facility] None    For next visit:  ROB with 28-week labs and TDaP    Subjective   Carolyn King is feeling lots of movement. She is concerned about her weight gain. She feels she has been improving her diet. She is interested in childbirth classes. She is planning to move to [facility] shortly after the baby is born.  Movement: Present Contractions: Not present  Objective   Flow sheet Vitals: Pulse Rate: 81 BP: 102/66 Fundal Height: 25 cm Fetal Heart Rate (bpm): 132 Total weight gain: 20 lb (9.072 kg)  General Appearance  No acute distress, well appearing, and well nourished Pulmonary   Normal work of breathing Neurologic   Alert and oriented to person, place, and time Psychiatric   Mood and affect within normal limits  Guadlupe Spanish, CNM 10/24/23 1:49 PM

## 2023-10-24 NOTE — Assessment & Plan Note (Signed)
-  Encouraged healthy eating habits and physical activity -Anticipatory guidance about the 3rd trimester and fetal development -Discussed 28-week labs for next visit -Reviewed s/s of PTL and when to seek medical attention -Link for CBE/hospital tour sent via MyChart

## 2023-11-13 NOTE — L&D Delivery Note (Signed)
 Delivery Note   MARVIS BAKKEN is a 23 y.o. G1P0 at [redacted]w[redacted]d Estimated Date of Delivery: 02/05/24  PRE-OPERATIVE DIAGNOSIS:  1) [redacted]w[redacted]d pregnancy.  2) gHTN 3) Prolonged premature rupture of membranes  POST-OPERATIVE DIAGNOSIS:  1) [redacted]w[redacted]d pregnancy s/p Vaginal, Spontaneous Above and  2) Laceration repair  Delivery Type: Vaginal, Spontaneous   Delivery Anesthesia: Epidural  Labor Complications:  none    ESTIMATED BLOOD LOSS: 500 ml    FINDINGS:   1) female infant, Apgar scores of 8   at 1 minute and 9   at 5 minutes and a birthweight of   ounces.     SPECIMENS:   PLACENTA:   Appearance: Intact, marginal cord insertion   Removal: Spontaneous     Disposition:  discarded  CORD BLOOD: lab  DISPOSITION:  Infant left in stable condition in the delivery room, with L&D personnel and mother,  NARRATIVE SUMMARY: Labor course:  LOLITHA TORTORA is a G1P0 at [redacted]w[redacted]d who presented to Labor & Delivery for PROM. Her initial cervical exam was 1/th/high. Labor proceeded with augmentation with oral misoprostol and pitocin and she was found to be completely dilated at 0200. With excellent maternal pushing effort, she birthed a viable female infant at 27. There was not a nuchal cord. The shoulders were birthed without difficulty. The infant was placed skin-to-skin with mother. The placenta delivered spontaneously and was noted to be intact with a 3VC. The cord was doubly clamped and cut after delivery of the placenta.  A perineal and vaginal examination was performed. Bright red blood was visualized with small gushes from a vaginal tear. TXA was administered. Episiotomy/Lacerations: Vaginal Lacerations were repaired with Vicryl suture using epidural anesthesia. The patient tolerated this well. Mother and baby were left in stable condition.  Dr. Lonny Prude was immediately available for the care of this patient.  Lindalou Hose Dijon Cosens, CNM 01/28/2024 3:21 AM

## 2023-11-20 ENCOUNTER — Other Ambulatory Visit: Payer: Self-pay

## 2023-11-20 DIAGNOSIS — Z113 Encounter for screening for infections with a predominantly sexual mode of transmission: Secondary | ICD-10-CM

## 2023-11-20 DIAGNOSIS — Z3A29 29 weeks gestation of pregnancy: Secondary | ICD-10-CM

## 2023-11-20 DIAGNOSIS — Z130 Encounter for screening for diseases of the blood and blood-forming organs and certain disorders involving the immune mechanism: Secondary | ICD-10-CM

## 2023-11-20 DIAGNOSIS — Z131 Encounter for screening for diabetes mellitus: Secondary | ICD-10-CM

## 2023-11-21 ENCOUNTER — Encounter: Payer: Medicaid Other | Admitting: Licensed Practical Nurse

## 2023-11-21 ENCOUNTER — Encounter: Payer: Self-pay | Admitting: Advanced Practice Midwife

## 2023-11-21 ENCOUNTER — Other Ambulatory Visit: Payer: Medicaid Other

## 2023-11-21 ENCOUNTER — Ambulatory Visit (INDEPENDENT_AMBULATORY_CARE_PROVIDER_SITE_OTHER): Payer: Medicaid Other | Admitting: Advanced Practice Midwife

## 2023-11-21 VITALS — BP 124/75 | HR 82 | Wt 201.7 lb

## 2023-11-21 DIAGNOSIS — Z3A29 29 weeks gestation of pregnancy: Secondary | ICD-10-CM

## 2023-11-21 DIAGNOSIS — Z131 Encounter for screening for diabetes mellitus: Secondary | ICD-10-CM

## 2023-11-21 DIAGNOSIS — Z3403 Encounter for supervision of normal first pregnancy, third trimester: Secondary | ICD-10-CM

## 2023-11-21 DIAGNOSIS — Z130 Encounter for screening for diseases of the blood and blood-forming organs and certain disorders involving the immune mechanism: Secondary | ICD-10-CM

## 2023-11-21 DIAGNOSIS — Z113 Encounter for screening for infections with a predominantly sexual mode of transmission: Secondary | ICD-10-CM

## 2023-11-21 LAB — POCT URINALYSIS DIPSTICK
Bilirubin, UA: NEGATIVE
Blood, UA: NEGATIVE
Glucose, UA: NEGATIVE
Ketones, UA: NEGATIVE
Leukocytes, UA: NEGATIVE
Nitrite, UA: NEGATIVE
Protein, UA: NEGATIVE
Spec Grav, UA: 1.015 (ref 1.010–1.025)
Urobilinogen, UA: 0.2 U/dL
pH, UA: 7 (ref 5.0–8.0)

## 2023-11-21 NOTE — Progress Notes (Signed)
 Routine Prenatal Care Visit  Subjective  Carolyn King is a 23 y.o. G1P0 at [redacted]w[redacted]d being seen today for ongoing prenatal care.  She is currently monitored for the following issues for this low-risk pregnancy and has Supervision of normal pregnancy on their problem list.  ----------------------------------------------------------------------------------- Patient reports she is doing well. She has not taken CBE. Has been watching videos on App tracker. Plans to breastfeed. Recommended BF class. 28 week labs today.   Contractions: Not present. Vag. Bleeding: None.  Movement: Present. Leaking Fluid denies.  ----------------------------------------------------------------------------------- The following portions of the patient's history were reviewed and updated as appropriate: allergies, current medications, past family history, past medical history, past social history, past surgical history and problem list. Problem list updated.  Objective  Blood pressure (!) 110/91, pulse 87, weight 201 lb 11.2 oz (91.5 kg), last menstrual period 05/01/2023. Pregravid weight 178 lb (80.7 kg) Total Weight Gain 23 lb 11.2 oz (10.8 kg) Urinalysis: Urine Protein    Urine Glucose    Fetal Status: Fetal Heart Rate (bpm): 137 Fundal Height: 29 cm Movement: Present     General:  Alert, oriented and cooperative. Patient is in no acute distress.  Skin: Skin is warm and dry. No rash noted.   Cardiovascular: Normal heart rate noted  Respiratory: Normal respiratory effort, no problems with respiration noted  Abdomen: Soft, gravid, appropriate for gestational age. Pain/Pressure: Absent     Pelvic:  Cervical exam deferred        Extremities: Normal range of motion.  Edema: None  Mental Status: Normal mood and affect. Normal behavior. Normal judgment and thought content.   Assessment   23 y.o. G1P0 at [redacted]w[redacted]d by  02/05/2024, by Last Menstrual Period presenting for routine prenatal visit  Plan   first Problems (from  07/05/23 to present)     Problem Noted Diagnosed Resolved   Supervision of normal pregnancy 07/05/2023 by Vicci Levan, CMA  No   Overview Addendum 09/05/2023 11:29 AM by Rutherford Lauraine PARAS, CNM   Clinical Staff Provider  Office Location  [facility] Dating  9/12, EDD 3/25  Language  English Anatomy US     Flu Vaccine  declined Genetic Screen  NIPS: low risk, XX  TDaP vaccine   offer Hgb A1C or  GTT Early : Third trimester :   Covid One dose   LAB RESULTS   Rhogam  O/Positive/-- (09/25 1532)  Blood Type O/Positive/-- (09/25 1532)   Feeding Plan breast Antibody Negative (09/25 1532)  Contraception undecided Rubella 8.03 (09/25 1532)  Circumcision undecided RPR Non Reactive (09/25 1532)   Pediatrician  Undecided  HBsAg Negative (09/25 1532)   Support Person Rogelio HIV Non Reactive (09/25 1532)  Prenatal Classes no Varicella Reactive (09/25 1532)    GBS  (For PCN allergy, check sensitivities)   BTL Consent  Hep C Non Reactive (09/25 1532)   VBAC Consent  Pap No results found for: DIAGPAP    Hgb Electro      CF      SMA                    Preterm labor symptoms and general obstetric precautions including but not limited to vaginal bleeding, contractions, leaking of fluid and fetal movement were reviewed in detail with the patient. Please refer to After Visit Summary for other counseling recommendations.   Return in about 2 weeks (around 12/05/2023) for rob.  Slater Rains, CNM 11/21/2023 9:47 AM

## 2023-11-21 NOTE — Patient Instructions (Signed)

## 2023-11-21 NOTE — Addendum Note (Signed)
 Addended by: Burtis Junes on: 11/21/2023 09:49 AM   Modules accepted: Orders

## 2023-11-23 LAB — 28 WEEK RH+PANEL
Basophils Absolute: 0 x10E3/uL (ref 0.0–0.2)
Basos: 0 %
EOS (ABSOLUTE): 0.1 x10E3/uL (ref 0.0–0.4)
Eos: 1 %
Gestational Diabetes Screen: 78 mg/dL (ref 70–139)
HIV Screen 4th Generation wRfx: NONREACTIVE
Hematocrit: 34.3 % (ref 34.0–46.6)
Hemoglobin: 11.3 g/dL (ref 11.1–15.9)
Immature Grans (Abs): 0.1 x10E3/uL (ref 0.0–0.1)
Immature Granulocytes: 1 %
Lymphocytes Absolute: 1.8 x10E3/uL (ref 0.7–3.1)
Lymphs: 18 %
MCH: 30.1 pg (ref 26.6–33.0)
MCHC: 32.9 g/dL (ref 31.5–35.7)
MCV: 91 fL (ref 79–97)
Monocytes Absolute: 0.6 x10E3/uL (ref 0.1–0.9)
Monocytes: 6 %
Neutrophils Absolute: 7.3 x10E3/uL — ABNORMAL HIGH (ref 1.4–7.0)
Neutrophils: 74 %
Platelets: 366 x10E3/uL (ref 150–450)
RBC: 3.76 x10E6/uL — ABNORMAL LOW (ref 3.77–5.28)
RDW: 12.1 % (ref 11.7–15.4)
RPR Ser Ql: NONREACTIVE
WBC: 9.8 x10E3/uL (ref 3.4–10.8)

## 2023-12-05 ENCOUNTER — Ambulatory Visit (INDEPENDENT_AMBULATORY_CARE_PROVIDER_SITE_OTHER): Payer: Medicaid Other | Admitting: Certified Nurse Midwife

## 2023-12-05 ENCOUNTER — Encounter: Payer: Self-pay | Admitting: Certified Nurse Midwife

## 2023-12-05 VITALS — BP 105/66 | HR 86 | Wt 202.1 lb

## 2023-12-05 DIAGNOSIS — Z3403 Encounter for supervision of normal first pregnancy, third trimester: Secondary | ICD-10-CM

## 2023-12-05 DIAGNOSIS — Z3A31 31 weeks gestation of pregnancy: Secondary | ICD-10-CM

## 2023-12-05 DIAGNOSIS — Z23 Encounter for immunization: Secondary | ICD-10-CM | POA: Diagnosis not present

## 2023-12-05 NOTE — Progress Notes (Signed)
    Return Prenatal Note   Subjective   23 y.o. G1P0 at [redacted]w[redacted]d presents for this follow-up prenatal visit.  Patient feeling well, active baby. Hoping for unmedicated labor, will have husband & her mom for support in labor. Planning to breastfeed. Patient reports: Movement: Present Contractions: Irritability  Objective   Flow sheet Vitals: Pulse Rate: 86 BP: 105/66 Fundal Height: 31 cm Fetal Heart Rate (bpm): 135 Presentation: Vertex Total weight gain: 24 lb 1.6 oz (10.9 kg)  General Appearance  No acute distress, well appearing, and well nourished Pulmonary   Normal work of breathing Neurologic   Alert and oriented to person, place, and time Psychiatric   Mood and affect within normal limits  Assessment/Plan   Plan  23 y.o. G1P0 at [redacted]w[redacted]d presents for follow-up OB visit. Reviewed prenatal record including previous visit note.  Supervision of normal pregnancy Reviewed kick counts and preterm labor warning signs. Instructed to call office or come to hospital with persistent headache, vision changes, regular contractions, leaking of fluid, decreased fetal movement or vaginal bleeding.   Urine culture today for screening for ASB as not collected with NOB panel.   Orders Placed This Encounter  Procedures   Culture, OB Urine   Flu vaccine trivalent PF, 6mos and older(Flulaval,Afluria,Fluarix,Fluzone)   POC Urinalysis Dipstick OB   Return in 2 weeks (on 12/19/2023) for ROB.   No future appointments.  For next visit:  continue with routine prenatal care     Dominica Severin, CNM  12/04/2508:49 AM

## 2023-12-05 NOTE — Assessment & Plan Note (Signed)
Reviewed kick counts and preterm labor warning signs. Instructed to call office or come to hospital with persistent headache, vision changes, regular contractions, leaking of fluid, decreased fetal movement or vaginal bleeding. 

## 2023-12-05 NOTE — Addendum Note (Signed)
Addended by: Sheliah Hatch on: 12/05/2023 02:02 PM   Modules accepted: Orders

## 2023-12-05 NOTE — Patient Instructions (Addendum)
Third Trimester of Pregnancy  The third trimester of pregnancy is from week 28 through week 40. This is months 7 through 9. The third trimester is a time when your baby is growing fast. Body changes during your third trimester Your body continues to change during this time. The changes usually go away after your baby is born. Physical changes You will continue to gain weight. You may get stretch marks on your hips, belly, and breasts. Your breasts will keep growing and may hurt. A yellow fluid (colostrum) may leak from your breasts. This is the first milk you're making for your baby. Your hair may grow faster and get thicker. In some cases, you may get hair loss. Your belly button may stick out. You may have more swelling in your hands, face, or ankles. Health changes You may have heartburn. You may feel short of breath. This is caused by the uterus that is now bigger. You may have more aches in the pelvis, back, or thighs. You may have more tingling or numbness in your hands, arms, and legs. You may pee more often. You may have trouble pooping (constipation) or swollen veins in the butt that can itch or get painful (hemorrhoids). Other changes You may have more problems sleeping. You may notice the baby moving lower in your belly (dropping). You may have more fluid coming from your vagina. Your joints may feel loose, and you may have pain around your pelvic bone. Follow these instructions at home: Medicines Take medicines only as told by your health care provider. Some medicines are not safe during pregnancy. Your provider may change the medicines that you take. Do not take any medicines unless told to by your provider. Take a prenatal vitamin that has at least 600 micrograms (mcg) of folic acid. Do not use herbal medicines, illegal drugs, or medicines that are not approved by your provider. Eating and drinking While you're pregnant your body needs additional nutrition to help  support your growing baby. Talk with your provider about your nutritional needs. Activity Most women are able to exercise regularly during pregnancy. Exercise routines may need to change at the end of your pregnancy. Talk to your provider about your activities and exercise routine. Relieving pain and discomfort Rest often with your legs raised if you have leg cramps or low back pain. Take warm sitz baths to soothe pain from hemorrhoids. Use hemorrhoid cream if your provider says it's okay. Wear a good, supportive bra if your breasts hurt. Do not use hot tubs, steam rooms, or saunas. Do not douche. Do not use tampons or scented pads. Safety Talk to your provider before traveling far distances. Wear your seatbelt at all times when you're in a car. Talk to your provider if someone hits you, hurts you, or yells at you. Preparing for birth To prepare for your baby: Take childbirth and breastfeeding classes. Visit the hospital and tour the maternity area. Buy a rear-facing car seat. Learn how to install it in your car. General instructions Avoid cat litter boxes and soil used by cats. These things carry germs that can cause harm to your pregnancy and your baby. Do not drink alcohol, smoke, vape, or use products with nicotine or tobacco in them. If you need help quitting, talk with your provider. Keep all follow-up visits for your third trimester. Your provider will do more exams and tests during this trimester. Write down your questions. Take them to your prenatal visits. Your provider also will: Talk with you about  your overall health. Give you advice or refer you to specialists who can help with different needs, including: Mental health and counseling. Foods and healthy eating. Ask for help if you need help with food. Where to find more information American Pregnancy Association: americanpregnancy.org [employer] of Obstetricians and Gynecologists: acog.org Office on [employer] Health:  TravelLesson.ca Contact a health care provider if: You have a headache that does not go away when you take medicine. You have any of these problems: You can't eat or drink. You have nausea and vomiting. You have watery poop (diarrhea) for 2 days or more. You have pain when you pee, or your pee smells bad. You have been sick for 2 days or more and aren't getting better. Contact your provider right away if: You have any of these coming from your vagina: Abnormal discharge. Bad-smelling fluid. Bleeding. Your baby is moving less than usual. You have signs of labor: You have any contractions, belly cramping, or have pain in your pelvis or lower back before 37 weeks of pregnancy (preterm labor). You have regular contractions that are less than 5 minutes apart. Your water breaks. You have symptoms of high blood pressure or preeclampsia. These include: A severe, throbbing headache that does not go away. Sudden or extreme swelling of your face, hands, legs, or feet. Vision problems: You see spots. You have blurry vision. Your eyes are sensitive to light. If you can't reach your provider, go to an urgent care or emergency room. Get help right away if: You faint, become confused, or can't think clearly. You have chest pain or trouble breathing. You have any kind of injury, such as from a fall or a car crash. These symptoms may be an emergency. Call 911 right away. Do not wait to see if the symptoms will go away. Do not drive yourself to the hospital. This information is not intended to replace advice given to you by your health care provider. Make sure you discuss any questions you have with your health care provider. Document Revised: 08/01/2023 Document Reviewed: 03/01/2023 Elsevier Patient Education  2024 [employer]. Birth Control Options Birth control is also called contraception. Birth control prevents pregnancy. There are many types of birth control. Work with your health care  provider to find the best option for you. Birth control that uses hormones These types of birth control have hormones in them to prevent pregnancy. Birth control implant This is a small tube that is put into the skin of your arm. The tube can stay in for up to 3 years. Birth control shot These are shots you get every 3 months. Birth control pills This is a pill you take every day. You need to take it at the same time each day. Birth control patch This is a patch that you put on your skin. You change it 1 time each week for 3 weeks. After that, you take the patch off for 1 week. Vaginal ring  This is a soft plastic ring that you put in your vagina. The ring is left in for 3 weeks. Then, you take it out for 1 week. Then, you put a new ring in. Barrier methods  Female condom This is a thin covering that you put on the penis before sex. The condom is thrown away after sex. Female condom This is a soft, loose covering that you put in the vagina before sex. The condom is thrown away after sex. Diaphragm A diaphragm is a soft barrier that is shaped like  a bowl. It must be made to fit your body. You put it in the vagina before sex with a chemical that kills sperm called spermicide. A diaphragm should be left in the vagina for 6-8 hours after sex and taken out within 24 hours. You need to replace a diaphragm: Every 1-2 years. After giving birth. After gaining more than 15 lb (6.8 kg). If you have surgery on your pelvis. Cervical cap This is a small, soft cup that fits over the cervix. The cervix is the lowest part of the uterus. It's put in the vagina before sex, along with spermicide. The cap must be made for you. The cap should be left in for 6-8 hours after sex. It is taken out within 48 hours. A cervical cap must be prescribed and fit to your body by a provider. It should be replaced every 2 years. Sponge This is a small sponge that is put into the vagina before sex. It must be left in for at  least 6 hours after sex. It must be taken out within 30 hours and thrown away. Spermicides These are chemicals that kill or stop sperm from getting into the uterus. They may be a pill, cream, jelly, or foam that you put into your vagina. They should be used at least 10-15 minutes before sex. Intrauterine device An intrauterine device (IUD) is a device that's put in the uterus by a provider. There are two types: Hormone IUD. This kind can stay in for 3-5 years. Copper IUD. This kind can stay in for 10 years. Permanent birth control Female tubal ligation This is surgery to block the fallopian tubes. Female sterilization This is a surgery, called a vasectomy, to tie off the tubes that carry sperm in men. This method takes 3 months to work. Other forms of birth control must be used for 3 months. Natural planning methods This means not having sex on the days the female partner could get pregnant. Here are some types of natural planning birth control: Using a calendar: To keep track of the length of each menstrual cycle. To find out what days pregnancy can happen. To plan to not have sex on days when pregnancy can happen. Watching for signs of ovulation and not having sex during this time. The female partner can check for ovulation by keeping track of their temperature each day. They can also look for changes in the mucus that comes from the cervix. Where to find more information Centers for Disease Control and Prevention: TonerPromos.no. Then: Enter "birth control" in the search box. This information is not intended to replace advice given to you by your health care provider. Make sure you discuss any questions you have with your health care provider. Document Revised: 08/01/2023 Document Reviewed: 12/25/2022 Elsevier Patient Education  2024 Elsevier Inc.  Influenza (Flu) Vaccine (Inactivated or Recombinant): What You Need to Know  Many vaccine information statements are available in Spanish and other  languages. See PromoAge.com.br. 1. Why get vaccinated? Influenza vaccine can prevent influenza (flu). Flu is a contagious disease that spreads around the [place] every year, usually between October and May. Anyone can get the flu, but it is more dangerous for some people. Infants and young children, people 32 years and older, pregnant people, and people with certain health conditions or a weakened immune system are at greatest risk of flu complications. Pneumonia, bronchitis, sinus infections, and ear infections are examples of flu-related complications. If you have a medical condition, such as heart disease,  cancer, or diabetes, flu can make it worse. Flu can cause fever and chills, sore throat, muscle aches, fatigue, cough, headache, and runny or stuffy nose. Some people may have vomiting and diarrhea, though this is more common in children than adults. In an average year, thousands of people in the [place] die from flu, and many more are hospitalized. Flu vaccine prevents millions of illnesses and flu-related visits to the doctor each year. 2. Influenza vaccines CDC recommends everyone 6 months and older get vaccinated every flu season. Children 6 months through 46 years of age may need 2 doses during a single flu season. Everyone else needs only 1 dose each flu season. It takes about 2 weeks for protection to develop after vaccination. There are many flu viruses, and they are always changing. Each year a new flu vaccine is made to protect against the influenza viruses believed to be likely to cause disease in the upcoming flu season. Even when the vaccine doesn't exactly match these viruses, it may still provide some protection. Influenza vaccine does not cause flu. Influenza vaccine may be given at the same time as other vaccines. 3. Talk with your health care provider Tell your vaccination provider if the person getting the vaccine: Has had an allergic reaction after a previous  dose of influenza vaccine, or has any severe, life-threatening allergies Has ever had Guillain-Barr Syndrome (also called "GBS") In some cases, your health care provider may decide to postpone influenza vaccination until a future visit. Influenza vaccine can be administered at any time during pregnancy. People who are or will be pregnant during influenza season should receive inactivated influenza vaccine. People with minor illnesses, such as a cold, may be vaccinated. People who are moderately or severely ill should usually wait until they recover before getting influenza vaccine. Your health care provider can give you more information. 4. Risks of a vaccine reaction Soreness, redness, and swelling where the shot is given, fever, muscle aches, and headache can happen after influenza vaccination. There may be a very small increased risk of Guillain-Barr Syndrome (GBS) after inactivated influenza vaccine (the flu shot). Young children who get the flu shot along with pneumococcal vaccine (PCV13) and/or DTaP vaccine at the same time might be slightly more likely to have a seizure caused by fever. Tell your health care provider if a child who is getting flu vaccine has ever had a seizure. People sometimes faint after medical procedures, including vaccination. Tell your provider if you feel dizzy or have vision changes or ringing in the ears. As with any medicine, there is a very remote chance of a vaccine causing a severe allergic reaction, other serious injury, or death. 5. What if there is a serious problem? An allergic reaction could occur after the vaccinated person leaves the clinic. If you see signs of a severe allergic reaction (hives, swelling of the face and throat, difficulty breathing, a fast heartbeat, dizziness, or weakness), call 9-1-1 and get the person to the nearest hospital. For other signs that concern you, call your health care provider. Adverse reactions should be reported to the  Vaccine Adverse Event Reporting System (VAERS). Your health care provider will usually file this report, or you can do it yourself. Visit the VAERS website at www.vaers.LAgents.no or call (919)271-6546. VAERS is only for reporting reactions, and VAERS staff members do not give medical advice. 6. The National Vaccine Injury Compensation Program The [employer] Vaccine Injury Compensation Program (VICP) is a federal program that was created to compensate  people who may have been injured by certain vaccines. Claims regarding alleged injury or death due to vaccination have a time limit for filing, which may be as short as two years. Visit the VICP website at SpiritualWord.at or call 434-093-3211 to learn about the program and about filing a claim. 7. How can I learn more? Ask your health care provider. Call your local or state health department. Visit the website of the Food and Drug Administration (FDA) for vaccine package inserts and additional information at FinderList.no. Contact the Centers for Disease Control and Prevention (CDC): Call 504 739 9863 (1-800-CDC-INFO) or Visit CDC's website at BiotechRoom.com.cy. Source: CDC Vaccine Information Statement Inactivated Influenza Vaccine (06/17/2020) This same material is available at FootballExhibition.com.br for no charge. This information is not intended to replace advice given to you by your health care provider. Make sure you discuss any questions you have with your health care provider. Document Revised: 02/13/2023 Document Reviewed: 11/19/2022 Elsevier Patient Education  2024 [employer].

## 2023-12-19 ENCOUNTER — Ambulatory Visit (INDEPENDENT_AMBULATORY_CARE_PROVIDER_SITE_OTHER): Payer: Medicaid Other | Admitting: Certified Nurse Midwife

## 2023-12-19 VITALS — BP 102/68 | HR 87 | Wt 205.0 lb

## 2023-12-19 DIAGNOSIS — Z3490 Encounter for supervision of normal pregnancy, unspecified, unspecified trimester: Secondary | ICD-10-CM | POA: Diagnosis not present

## 2023-12-19 DIAGNOSIS — Z3A33 33 weeks gestation of pregnancy: Secondary | ICD-10-CM | POA: Diagnosis not present

## 2023-12-19 NOTE — Assessment & Plan Note (Signed)
 Reviewed kick counts and preterm labor warning signs. Instructed to call office or come to hospital with persistent headache, vision changes, regular contractions, leaking of fluid, decreased fetal movement or vaginal bleeding. Anticipatory guidance for GBS & Gc/Ct reviewed.

## 2023-12-19 NOTE — Progress Notes (Signed)
    Return Prenatal Note   Subjective   23 y.o. G1P0 at [redacted]w[redacted]d presents for this follow-up prenatal visit.  Patient feeling well, active baby. Feeling tightness & occasional menstrual like cramps. BSUS confirms vertex. Desires TDAP at next visit. Patient reports: Movement: Present Contractions: Not present  Objective   Flow sheet Vitals: Pulse Rate: 87 BP: 102/68 Fundal Height: 33 cm Fetal Heart Rate (bpm): 140 Presentation: Vertex (confirmed on BSUS) Total weight gain: 27 lb (12.2 kg)  General Appearance  No acute distress, well appearing, and well nourished Pulmonary   Normal work of breathing Neurologic   Alert and oriented to person, place, and time Psychiatric   Mood and affect within normal limits  Assessment/Plan   Plan  23 y.o. G1P0 at [redacted]w[redacted]d presents for follow-up OB visit. Reviewed prenatal record including previous visit note.  Supervision of normal pregnancy Reviewed kick counts and preterm labor warning signs. Instructed to call office or come to hospital with persistent headache, vision changes, regular contractions, leaking of fluid, decreased fetal movement or vaginal bleeding. Anticipatory guidance for GBS & Gc/Ct reviewed.      Orders Placed This Encounter  Procedures   Urine Culture   Return in 2 weeks (on 01/02/2024) for ROB.   Future Appointments  Date Time Provider Department Center  01/02/2024 10:55 AM Janit Alm Agent, MD [facility] None    For next visit:  continue with routine prenatal care     Harlene LITTIE Cisco, CNM  02/06/252:30 PM

## 2023-12-19 NOTE — Patient Instructions (Signed)
 Third Trimester of Pregnancy  The third trimester of pregnancy is from week 28 through week 40. This is months 7 through 9. The third trimester is a time when your baby is growing fast. Body changes during your third trimester Your body continues to change during this time. The changes usually go away after your baby is born. Physical changes You will continue to gain weight. You may get stretch marks on your hips, belly, and breasts. Your breasts will keep growing and may hurt. A yellow fluid (colostrum) may leak from your breasts. This is the first milk you're making for your baby. Your hair may grow faster and get thicker. In some cases, you may get hair loss. Your belly button may stick out. You may have more swelling in your hands, face, or ankles. Health changes You may have heartburn. You may feel short of breath. This is caused by the uterus that is now bigger. You may have more aches in the pelvis, back, or thighs. You may have more tingling or numbness in your hands, arms, and legs. You may pee more often. You may have trouble pooping (constipation) or swollen veins in the butt that can itch or get painful (hemorrhoids). Other changes You may have more problems sleeping. You may notice the baby moving lower in your belly (dropping). You may have more fluid coming from your vagina. Your joints may feel loose, and you may have pain around your pelvic bone. Follow these instructions at home: Medicines Take medicines only as told by your health care provider. Some medicines are not safe during pregnancy. Your provider may change the medicines that you take. Do not take any medicines unless told to by your provider. Take a prenatal vitamin that has at least 600 micrograms (mcg) of folic acid. Do not use herbal medicines, illegal drugs, or medicines that are not approved by your provider. Eating and drinking While you're pregnant your body needs additional nutrition to help  support your growing baby. Talk with your provider about your nutritional needs. Activity Most women are able to exercise regularly during pregnancy. Exercise routines may need to change at the end of your pregnancy. Talk to your provider about your activities and exercise routine. Relieving pain and discomfort Rest often with your legs raised if you have leg cramps or low back pain. Take warm sitz baths to soothe pain from hemorrhoids. Use hemorrhoid cream if your provider says it's okay. Wear a good, supportive bra if your breasts hurt. Do not use hot tubs, steam rooms, or saunas. Do not douche. Do not use tampons or scented pads. Safety Talk to your provider before traveling far distances. Wear your seatbelt at all times when you're in a car. Talk to your provider if someone hits you, hurts you, or yells at you. Preparing for birth To prepare for your baby: Take childbirth and breastfeeding classes. Visit the hospital and tour the maternity area. Buy a rear-facing car seat. Learn how to install it in your car. General instructions Avoid cat litter boxes and soil used by cats. These things carry germs that can cause harm to your pregnancy and your baby. Do not drink alcohol, smoke, vape, or use products with nicotine or tobacco in them. If you need help quitting, talk with your provider. Keep all follow-up visits for your third trimester. Your provider will do more exams and tests during this trimester. Write down your questions. Take them to your prenatal visits. Your provider also will: Talk with you about  your overall health. Give you advice or refer you to specialists who can help with different needs, including: Mental health and counseling. Foods and healthy eating. Ask for help if you need help with food. Where to find more information American Pregnancy Association: americanpregnancy.org [employer]: acog.org Office on [employer] Health:  [employer] Contact a health care provider if: You have a headache that does not go away when you take medicine. You have any of these problems: You can't eat or drink. You have nausea and vomiting. You have watery poop (diarrhea) for 2 days or more. You have pain when you pee, or your pee smells bad. You have been sick for 2 days or more and aren't getting better. Contact your provider right away if: You have any of these coming from your vagina: Abnormal discharge. Bad-smelling fluid. Bleeding. Your baby is moving less than usual. You have signs of labor: You have any contractions, belly cramping, or have pain in your pelvis or lower back before 37 weeks of pregnancy (preterm labor). You have regular contractions that are less than 5 minutes apart. Your water breaks. You have symptoms of high blood pressure or preeclampsia. These include: A severe, throbbing headache that does not go away. Sudden or extreme swelling of your face, hands, legs, or feet. Vision problems: You see spots. You have blurry vision. Your eyes are sensitive to light. If you can't reach your provider, go to an urgent care or emergency room. Get help right away if: You faint, become confused, or can't think clearly. You have chest pain or trouble breathing. You have any kind of injury, such as from a fall or a car crash. These symptoms may be an emergency. Call 911 right away. Do not wait to see if the symptoms will go away. Do not drive yourself to the hospital. This information is not intended to replace advice given to you by your health care provider. Make sure you discuss any questions you have with your health care provider. Document Revised: 08/01/2023 Document Reviewed: 03/01/2023 Elsevier Patient Education  2024 Else[employer] Inc. Group B Streptococcus Infection During Pregnancy Group B Streptococcus (GBS) is a type of bacteria that is often found in healthy people. It is commonly found in the  rectum, vagina, and intestines. In people who are healthy and not pregnant, the bacteria rarely cause serious illness or complications. However, women who test positive for GBS during pregnancy can pass the bacteria to the baby during childbirth. This can cause serious infection in the baby after birth. Women with GBS may also have infections during their pregnancy or soon after childbirth. The infections include urinary tract infections (UTIs) or infections of the uterus. GBS also increases a woman's risk of complications during pregnancy, such as early labor or delivery, miscarriage, or stillbirth. Routine testing for GBS is recommended for all pregnant women. What are the causes? This condition is caused by bacteria called Streptococcus agalactiae. What increases the risk? You may have a higher risk for GBS infection during pregnancy if you had one during a past pregnancy. What are the signs or symptoms? In most cases, GBS infection does not cause symptoms in pregnant women. If symptoms exist, they may include: Labor that starts before the 37th week of pregnancy. A UTI or bladder infection. This may cause a fever, frequent urination, or pain and burning during urination. Fever during labor. There can also be a rapid heartbeat in the mother or baby. Rare but serious symptoms of a GBS  infection in women include: Blood infection (septicemia). This may cause fever, chills, or confusion. Lung infection (pneumonia). This may cause fever, chills, cough, rapid breathing, chest pain, or difficulty breathing. Bone, joint, skin, or soft tissue infection. How is this diagnosed? You may be screened for GBS between week 35 and week 37 of pregnancy. If you have symptoms of preterm labor, you may be screened earlier. This condition is diagnosed based on lab test results from: A swab of fluid from the vagina and rectum. A urine sample. How is this treated? This condition is treated with antibiotic medicine.  Antibiotic medicine may be given: To you when you go into labor, or as soon as your water breaks. The medicines will continue until after you give birth. If you are having a cesarean delivery, you do not need antibiotics unless your water has broken. To your baby, if he or she requires treatment. Your health care provider will check your baby to decide if he or she needs antibiotics to prevent a serious infection. Follow these instructions at home: Take over-the-counter and prescription medicines only as told by your health care provider. Take your antibiotic medicine as told by your health care provider. Do not stop taking the antibiotic even if you start to feel better. Keep all pre-birth (prenatal) visits and follow-up visits as told by your health care provider. This is important. Contact a health care provider if: You have pain or burning when you urinate. You have to urinate more often than usual. You have a fever or chills. You develop a bad-smelling vaginal discharge. Get help right away if: Your water breaks. You go into labor. You have severe pain in your abdomen. You have difficulty breathing. You have chest pain. These symptoms may represent a serious problem that is an emergency. Do not wait to see if the symptoms will go away. Get medical help right away. Call your local emergency services (911 in the [place] Do not drive yourself to the hospital. Summary GBS is a type of bacteria that is common in healthy people. During pregnancy, colonization with GBS can cause serious complications for you or your baby. Your health care provider will screen you between 35 and 37 weeks of pregnancy to determine if you are colonized with GBS. If you are colonized with GBS during pregnancy, your health care provider will recommend antibiotics through an IV during labor. After delivery, your baby will be evaluated for complications related to potential GBS infection and may require antibiotics to  prevent a serious infection. This information is not intended to replace advice given to you by your health care provider. Make sure you discuss any questions you have with your health care provider. Document Revised: 10/15/2022 Document Reviewed: 10/15/2022 Elsevier Patient Education  2024 [employer].

## 2023-12-21 LAB — URINE CULTURE

## 2024-01-02 ENCOUNTER — Encounter: Payer: Medicaid Other | Admitting: Obstetrics and Gynecology

## 2024-01-02 DIAGNOSIS — Z3490 Encounter for supervision of normal pregnancy, unspecified, unspecified trimester: Secondary | ICD-10-CM

## 2024-01-02 DIAGNOSIS — Z23 Encounter for immunization: Secondary | ICD-10-CM

## 2024-01-02 DIAGNOSIS — Z3A35 35 weeks gestation of pregnancy: Secondary | ICD-10-CM

## 2024-01-06 ENCOUNTER — Other Ambulatory Visit (HOSPITAL_COMMUNITY)
Admission: RE | Admit: 2024-01-06 | Discharge: 2024-01-06 | Disposition: A | Discharge status: Home / Self Care | Payer: Medicaid Other | Source: Ambulatory Visit | Attending: Certified Nurse Midwife | Admitting: Certified Nurse Midwife

## 2024-01-06 ENCOUNTER — Ambulatory Visit (INDEPENDENT_AMBULATORY_CARE_PROVIDER_SITE_OTHER): Payer: Medicaid Other | Admitting: Certified Nurse Midwife

## 2024-01-06 VITALS — BP 119/78 | HR 86 | Wt 214.7 lb

## 2024-01-06 DIAGNOSIS — Z23 Encounter for immunization: Secondary | ICD-10-CM

## 2024-01-06 DIAGNOSIS — O133 Gestational [pregnancy-induced] hypertension without significant proteinuria, third trimester: Secondary | ICD-10-CM | POA: Diagnosis not present

## 2024-01-06 DIAGNOSIS — Z3490 Encounter for supervision of normal pregnancy, unspecified, unspecified trimester: Secondary | ICD-10-CM | POA: Insufficient documentation

## 2024-01-06 DIAGNOSIS — O139 Gestational [pregnancy-induced] hypertension without significant proteinuria, unspecified trimester: Secondary | ICD-10-CM | POA: Insufficient documentation

## 2024-01-06 DIAGNOSIS — Z3A35 35 weeks gestation of pregnancy: Secondary | ICD-10-CM | POA: Insufficient documentation

## 2024-01-06 DIAGNOSIS — Z3685 Encounter for antenatal screening for Streptococcus B: Secondary | ICD-10-CM

## 2024-01-06 NOTE — Assessment & Plan Note (Addendum)
 BP elevated at 29 weeks and this visit.  -PIH labs ordered -Growth [place] with BPP ordered. -Reactive NST today -Plan for weekly labs and antenatal surveillance.  -IOL by 39 weeks or with other indications.  -Reviewed all of above with patient. Reviewed warnings signs of pre-eclampsia

## 2024-01-06 NOTE — Assessment & Plan Note (Signed)
 Reviewed Braxton hicks and back pain relief. Reviewed kick counts and preterm labor warning signs. Instructed to call office or come to hospital with persistent headache, vision changes, regular contractions, leaking of fluid, decreased fetal movement or vaginal bleeding.

## 2024-01-06 NOTE — Progress Notes (Signed)
    Return Prenatal Note   Assessment/Plan   Plan  23 y.o. G1P0 at [redacted]w[redacted]d presents for follow-up OB visit. Reviewed prenatal record including previous visit note.  Gestational hypertension BP elevated at 29 weeks and this visit.  -PIH labs ordered -Growth [place] with BPP ordered. -Reactive NST today -Plan for weekly labs and antenatal surveillance.  -IOL by 39 weeks or with other indications.  -Reviewed all of above with patient. Reviewed warnings signs of pre-eclampsia   Supervision of normal pregnancy Reviewed Braxton hicks and back pain relief. Reviewed kick counts and preterm labor warning signs. Instructed to call office or come to hospital with persistent headache, vision changes, regular contractions, leaking of fluid, decreased fetal movement or vaginal bleeding.    Fetus A Non-Stress Test Interpretation for 01/06/24  Indication:  GHTN  Fetal Heart Rate A Mode: External Baseline Rate (A): 125 bpm Variability: Moderate Accelerations: 15 x 15 Decelerations: None Scalp Stimulation: Negative Multiple birth?: No  Uterine Activity Mode: Toco (irregular)  Interpretation (Fetal Testing) Nonstress Test Interpretation: Reactive Overall Impression: Reassuring for gestational age   Orders Placed This Encounter  Procedures   Strep Gp B NAA   US OB Follow Up    Growth    Standing Status:   Future    Expected Date:   01/06/2024    Expiration Date:   04/04/2024    Reason for exam::   GHTN    Preferred imaging location?:   Internal   Tdap vaccine greater than or equal to 7yo IM   CBC   Comprehensive metabolic panel   Protein / creatinine ratio, urine   No follow-ups on file.   No future appointments.  For next visit:   Weekly ROBs with NST/labs     Subjective   22 y.o. G1P0 at [redacted]w[redacted]d presents for this follow-up prenatal visit.  Patient having some braxton hicks and back pain.  Patient reports: Movement: Present Contractions: Irritability  Objective   Flow  sheet Vitals: Pulse Rate: 84 BP: (!) 145/78 Fundal Height: 36 cm Fetal Heart Rate (bpm): 140 Total weight gain: 36 lb 11.2 oz (16.6 kg)  General Appearance  No acute distress, well appearing, and well nourished Pulmonary   Normal work of breathing Neurologic   Alert and oriented to person, place, and time Psychiatric   Mood and affect within normal limits  Oley Balm, CNM  02/24/253:51 PM

## 2024-01-07 LAB — CBC
Hematocrit: 34.2 % (ref 34.0–46.6)
Hemoglobin: 12 g/dL (ref 11.1–15.9)
MCH: 30.6 pg (ref 26.6–33.0)
MCHC: 35.1 g/dL (ref 31.5–35.7)
MCV: 87 fL (ref 79–97)
Platelets: 335 10*3/uL (ref 150–450)
RBC: 3.92 x10E6/uL (ref 3.77–5.28)
RDW: 12.4 % (ref 11.7–15.4)
WBC: 8.8 10*3/uL (ref 3.4–10.8)

## 2024-01-07 LAB — COMPREHENSIVE METABOLIC PANEL
ALT: 6 [IU]/L (ref 0–32)
AST: 14 [IU]/L (ref 0–40)
Albumin: 3.7 g/dL — ABNORMAL LOW (ref 4.0–5.0)
Alkaline Phosphatase: 160 [IU]/L — ABNORMAL HIGH (ref 42–106)
BUN/Creatinine Ratio: 12 (ref 9–23)
BUN: 6 mg/dL (ref 6–20)
Bilirubin Total: 0.3 mg/dL (ref 0.0–1.2)
CO2: 20 mmol/L (ref 20–29)
Calcium: 8.9 mg/dL (ref 8.7–10.2)
Chloride: 102 mmol/L (ref 96–106)
Creatinine, Ser: 0.49 mg/dL — ABNORMAL LOW (ref 0.57–1.00)
Globulin, Total: 2.2 g/dL (ref 1.5–4.5)
Glucose: 100 mg/dL — ABNORMAL HIGH (ref 70–99)
Potassium: 4.2 mmol/L (ref 3.5–5.2)
Sodium: 137 mmol/L (ref 134–144)
Total Protein: 5.9 g/dL — ABNORMAL LOW (ref 6.0–8.5)
eGFR: 138 mL/min/{1.73_m2} (ref >59)

## 2024-01-07 LAB — PROTEIN / CREATININE RATIO, URINE
Creatinine, Urine: 101.5 mg/dL
Protein, Ur: 19.8 mg/dL
Protein/Creat Ratio: 195 mg/g{creat} (ref 0–200)

## 2024-01-08 LAB — CERVICOVAGINAL ANCILLARY ONLY
Chlamydia: NEGATIVE
Comment: NEGATIVE
Comment: NORMAL
Neisseria Gonorrhea: NEGATIVE

## 2024-01-08 LAB — STREP GP B NAA: Strep Gp B NAA: NEGATIVE

## 2024-01-13 ENCOUNTER — Ambulatory Visit (INDEPENDENT_AMBULATORY_CARE_PROVIDER_SITE_OTHER): Payer: Medicaid Other | Admitting: Licensed Practical Nurse

## 2024-01-13 ENCOUNTER — Encounter: Payer: Self-pay | Admitting: Licensed Practical Nurse

## 2024-01-13 ENCOUNTER — Ambulatory Visit: Payer: Medicaid Other

## 2024-01-13 VITALS — BP 124/81 | HR 87 | Wt 215.4 lb

## 2024-01-13 DIAGNOSIS — Z3490 Encounter for supervision of normal pregnancy, unspecified, unspecified trimester: Secondary | ICD-10-CM

## 2024-01-13 DIAGNOSIS — O133 Gestational [pregnancy-induced] hypertension without significant proteinuria, third trimester: Secondary | ICD-10-CM

## 2024-01-13 DIAGNOSIS — Z3A36 36 weeks gestation of pregnancy: Secondary | ICD-10-CM

## 2024-01-13 DIAGNOSIS — Z3403 Encounter for supervision of normal first pregnancy, third trimester: Secondary | ICD-10-CM

## 2024-01-13 NOTE — Patient Instructions (Signed)

## 2024-01-13 NOTE — Progress Notes (Signed)
    Return Prenatal Note   23 y.o. G1P0 at [redacted]w[redacted]d presents for this follow-up prenatal visit.  Patient Doing well. Mood has been good. Sleep is good. Her husband will return from [employer] training soon.  Patient reports: Movement: Present Contractions: Not present  Objective   Flow sheet Vitals: Pulse Rate: 87 BP: 124/81 Fetal Heart Rate (bpm): 135 Total weight gain: 37 lb 6.4 oz (17 kg)  General Appearance  No acute distress, well appearing, and well nourished Pulmonary   Normal work of breathing Neurologic   Alert and oriented to person, place, and time Psychiatric   Mood and affect within normal limits  23 y.o. G1P0 at [redacted]w[redacted]d presents for follow-up OB visit. Reviewed prenatal record including previous visit note.  Gestational hypertension -normotensive today  -Labs and Nst today  -repeat labs and NST in 1 week -Set up IOL next visit   Supervision of normal pregnancy -[place] today shows EFW 18% -TWG 37lbs, BMI 39.4, discussed IOL by due date for BMI >40 -Her husband and mother will be her labor support  -warning signs reviewed      Orders Placed This Encounter  Procedures   CBC   Comprehensive metabolic panel   Protein / creatinine ratio, urine   Return in about 1 week (around 01/20/2024) for ROB, NST and PIH labs .   No future appointments.  For next visit:  ROB with NST     Tulip Meharg M Orlanda Frankum, CNM  03/03/253:00 PM

## 2024-01-13 NOTE — Assessment & Plan Note (Signed)
-  normotensive today  -Labs and Nst today  -repeat labs and NST in 1 week -Set up IOL next visit

## 2024-01-13 NOTE — Assessment & Plan Note (Addendum)
-  [place] today shows EFW 18% -TWG 37lbs, BMI 39.4, discussed IOL by due date for BMI >40 -Her husband and mother will be her labor support  -warning signs reviewed

## 2024-01-14 LAB — COMPREHENSIVE METABOLIC PANEL
ALT: 6 IU/L (ref 0–32)
AST: 14 IU/L (ref 0–40)
Albumin: 3.5 g/dL — ABNORMAL LOW (ref 4.0–5.0)
Alkaline Phosphatase: 169 IU/L — ABNORMAL HIGH (ref 42–106)
BUN/Creatinine Ratio: 12 (ref 9–23)
BUN: 7 mg/dL (ref 6–20)
Bilirubin Total: 0.4 mg/dL (ref 0.0–1.2)
CO2: 20 mmol/L (ref 20–29)
Calcium: 9.1 mg/dL (ref 8.7–10.2)
Chloride: 101 mmol/L (ref 96–106)
Creatinine, Ser: 0.57 mg/dL (ref 0.57–1.00)
Globulin, Total: 2.6 g/dL (ref 1.5–4.5)
Glucose: 71 mg/dL (ref 70–99)
Potassium: 4.8 mmol/L (ref 3.5–5.2)
Sodium: 136 mmol/L (ref 134–144)
Total Protein: 6.1 g/dL (ref 6.0–8.5)
eGFR: 133 mL/min/{1.73_m2} (ref >59)

## 2024-01-14 LAB — CBC
Hematocrit: 37.1 % (ref 34.0–46.6)
Hemoglobin: 12.4 g/dL (ref 11.1–15.9)
MCH: 29 pg (ref 26.6–33.0)
MCHC: 33.4 g/dL (ref 31.5–35.7)
MCV: 87 fL (ref 79–97)
Platelets: 395 10*3/uL (ref 150–450)
RBC: 4.28 x10E6/uL (ref 3.77–5.28)
RDW: 12.5 % (ref 11.7–15.4)
WBC: 10.4 10*3/uL (ref 3.4–10.8)

## 2024-01-15 LAB — PROTEIN / CREATININE RATIO, URINE
Creatinine, Urine: 99.2 mg/dL
Protein, Ur: 27.5 mg/dL
Protein/Creat Ratio: 277 mg/g{creat} — ABNORMAL HIGH (ref 0–200)

## 2024-01-20 ENCOUNTER — Ambulatory Visit (INDEPENDENT_AMBULATORY_CARE_PROVIDER_SITE_OTHER): Admitting: Certified Nurse Midwife

## 2024-01-20 ENCOUNTER — Other Ambulatory Visit

## 2024-01-20 VITALS — BP 102/69 | HR 80 | Wt 216.6 lb

## 2024-01-20 DIAGNOSIS — Z3403 Encounter for supervision of normal first pregnancy, third trimester: Secondary | ICD-10-CM

## 2024-01-20 DIAGNOSIS — Z3A37 37 weeks gestation of pregnancy: Secondary | ICD-10-CM | POA: Insufficient documentation

## 2024-01-20 DIAGNOSIS — O133 Gestational [pregnancy-induced] hypertension without significant proteinuria, third trimester: Secondary | ICD-10-CM

## 2024-01-20 NOTE — Patient Instructions (Signed)

## 2024-01-20 NOTE — Assessment & Plan Note (Signed)
 IOL scheduled for 3/19 at [facility]. Reviewed induction interventions and what to expect at each stage of labor. Answered all questions.  Reviewed labor warning signs and expectations for birth. Instructed to call office or come to hospital with persistent headache, vision changes, regular contractions, leaking of fluid, decreased fetal movement or vaginal bleeding.

## 2024-01-20 NOTE — Assessment & Plan Note (Signed)
 Normotensive today and asymptomatic.  PIH labs collected

## 2024-01-20 NOTE — Progress Notes (Signed)
    Return Prenatal Note   Assessment/Plan   Plan  23 y.o. G1P0 at [redacted]w[redacted]d presents for follow-up OB visit. Reviewed prenatal record including previous visit note.  Gestational hypertension Normotensive today and asymptomatic.  PIH labs collected  Supervision of normal pregnancy IOL scheduled for 3/19 at [facility]. Reviewed induction interventions and what to expect at each stage of labor. Answered all questions.  Reviewed labor warning signs and expectations for birth. Instructed to call office or come to hospital with persistent headache, vision changes, regular contractions, leaking of fluid, decreased fetal movement or vaginal bleeding.    Orders Placed This Encounter  Procedures   CBC   Comprehensive metabolic panel   Protein / creatinine ratio, urine   No follow-ups on file.   Future Appointments  Date Time Provider Department Center  01/20/2024  3:15 PM Ethan Kasperski, Irving Burton, CNM [facility] None    For next visit:  continue with routine prenatal care and weekly NST     Subjective   22 y.o. G1P0 at [redacted]w[redacted]d presents for this follow-up prenatal visit.  Patient reports increased lightening crotch and vaginal pressure.  Patient reports: Movement: Present Contractions: Irritability  Objective   Flow sheet Vitals: Pulse Rate: 80 BP: 102/69 Fetal Heart Rate (bpm): 130 Total weight gain: 38 lb 9.6 oz (17.5 kg)  General Appearance  No acute distress, well appearing, and well nourished Pulmonary   Normal work of breathing Neurologic   Alert and oriented to person, place, and time Psychiatric   Mood and affect within normal limits  Oley Balm, CNM  03/10/253:00 PM

## 2024-01-21 LAB — COMPREHENSIVE METABOLIC PANEL
ALT: 8 IU/L (ref 0–32)
AST: 16 IU/L (ref 0–40)
Albumin: 3.7 g/dL — ABNORMAL LOW (ref 4.0–5.0)
Alkaline Phosphatase: 184 IU/L — ABNORMAL HIGH (ref 42–106)
BUN/Creatinine Ratio: 14 (ref 9–23)
BUN: 7 mg/dL (ref 6–20)
Bilirubin Total: 0.4 mg/dL (ref 0.0–1.2)
CO2: 19 mmol/L — ABNORMAL LOW (ref 20–29)
Calcium: 9.1 mg/dL (ref 8.7–10.2)
Chloride: 102 mmol/L (ref 96–106)
Creatinine, Ser: 0.5 mg/dL — ABNORMAL LOW (ref 0.57–1.00)
Globulin, Total: 2.6 g/dL (ref 1.5–4.5)
Glucose: 94 mg/dL (ref 70–99)
Potassium: 4.5 mmol/L (ref 3.5–5.2)
Sodium: 138 mmol/L (ref 134–144)
Total Protein: 6.3 g/dL (ref 6.0–8.5)
eGFR: 138 mL/min/{1.73_m2} (ref >59)

## 2024-01-21 LAB — PROTEIN / CREATININE RATIO, URINE
Creatinine, Urine: 137 mg/dL
Protein, Ur: 18.6 mg/dL
Protein/Creat Ratio: 136 mg/g{creat} (ref 0–200)

## 2024-01-21 LAB — CBC
Hematocrit: 36.5 % (ref 34.0–46.6)
Hemoglobin: 12.3 g/dL (ref 11.1–15.9)
MCH: 29.2 pg (ref 26.6–33.0)
MCHC: 33.7 g/dL (ref 31.5–35.7)
MCV: 87 fL (ref 79–97)
Platelets: 368 10*3/uL (ref 150–450)
RBC: 4.21 x10E6/uL (ref 3.77–5.28)
RDW: 12.6 % (ref 11.7–15.4)
WBC: 9.5 10*3/uL (ref 3.4–10.8)

## 2024-01-27 ENCOUNTER — Inpatient Hospital Stay
Admission: EM | Admit: 2024-01-27 | Discharge: 2024-01-29 | DRG: 806 | Disposition: A | Discharge status: Home / Self Care | Attending: Obstetrics | Admitting: Obstetrics

## 2024-01-27 ENCOUNTER — Other Ambulatory Visit: Payer: Self-pay

## 2024-01-27 ENCOUNTER — Inpatient Hospital Stay: Admitting: Anesthesiology

## 2024-01-27 ENCOUNTER — Other Ambulatory Visit

## 2024-01-27 ENCOUNTER — Encounter: Payer: Self-pay | Admitting: Obstetrics

## 2024-01-27 ENCOUNTER — Encounter: Admitting: Advanced Practice Midwife

## 2024-01-27 DIAGNOSIS — Z7982 Long term (current) use of aspirin: Secondary | ICD-10-CM

## 2024-01-27 DIAGNOSIS — Z833 Family history of diabetes mellitus: Secondary | ICD-10-CM | POA: Diagnosis not present

## 2024-01-27 DIAGNOSIS — K219 Gastro-esophageal reflux disease without esophagitis: Secondary | ICD-10-CM | POA: Diagnosis present

## 2024-01-27 DIAGNOSIS — O4290 Premature rupture of membranes, unspecified as to length of time between rupture and onset of labor, unspecified weeks of gestation: Secondary | ICD-10-CM | POA: Diagnosis present

## 2024-01-27 DIAGNOSIS — O4202 Full-term premature rupture of membranes, onset of labor within 24 hours of rupture: Secondary | ICD-10-CM | POA: Diagnosis not present

## 2024-01-27 DIAGNOSIS — O9962 Diseases of the digestive system complicating childbirth: Secondary | ICD-10-CM | POA: Diagnosis present

## 2024-01-27 DIAGNOSIS — O43193 Other malformation of placenta, third trimester: Secondary | ICD-10-CM | POA: Diagnosis present

## 2024-01-27 DIAGNOSIS — Z3403 Encounter for supervision of normal first pregnancy, third trimester: Principal | ICD-10-CM

## 2024-01-27 DIAGNOSIS — Z3A38 38 weeks gestation of pregnancy: Secondary | ICD-10-CM

## 2024-01-27 DIAGNOSIS — O134 Gestational [pregnancy-induced] hypertension without significant proteinuria, complicating childbirth: Secondary | ICD-10-CM | POA: Diagnosis present

## 2024-01-27 DIAGNOSIS — Z3490 Encounter for supervision of normal pregnancy, unspecified, unspecified trimester: Secondary | ICD-10-CM

## 2024-01-27 DIAGNOSIS — O139 Gestational [pregnancy-induced] hypertension without significant proteinuria, unspecified trimester: Secondary | ICD-10-CM | POA: Diagnosis present

## 2024-01-27 DIAGNOSIS — O4292 Full-term premature rupture of membranes, unspecified as to length of time between rupture and onset of labor: Principal | ICD-10-CM | POA: Diagnosis present

## 2024-01-27 LAB — CBC
HCT: 33.4 % — ABNORMAL LOW (ref 36.0–46.0)
Hemoglobin: 11.5 g/dL — ABNORMAL LOW (ref 12.0–15.0)
MCH: 29.3 pg (ref 26.0–34.0)
MCHC: 34.4 g/dL (ref 30.0–36.0)
MCV: 85 fL (ref 80.0–100.0)
Platelets: 342 10*3/uL (ref 150–400)
RBC: 3.93 MIL/uL (ref 3.87–5.11)
RDW: 13.2 % (ref 11.5–15.5)
WBC: 8.2 10*3/uL (ref 4.0–10.5)
nRBC: 0 % (ref 0.0–0.2)

## 2024-01-27 LAB — COMPREHENSIVE METABOLIC PANEL
ALT: 10 U/L (ref 0–44)
AST: 22 U/L (ref 15–41)
Albumin: 2.7 g/dL — ABNORMAL LOW (ref 3.5–5.0)
Alkaline Phosphatase: 125 U/L (ref 38–126)
Anion gap: 7 (ref 5–15)
BUN: 8 mg/dL (ref 6–20)
CO2: 22 mmol/L (ref 22–32)
Calcium: 8.7 mg/dL — ABNORMAL LOW (ref 8.9–10.3)
Chloride: 110 mmol/L (ref 98–111)
Creatinine, Ser: 0.43 mg/dL — ABNORMAL LOW (ref 0.44–1.00)
GFR, Estimated: >60 mL/min (ref >60)
Glucose, Bld: 87 mg/dL (ref 70–99)
Potassium: 4 mmol/L (ref 3.5–5.1)
Sodium: 139 mmol/L (ref 135–145)
Total Bilirubin: 0.6 mg/dL (ref 0.0–1.2)
Total Protein: 6.2 g/dL — ABNORMAL LOW (ref 6.5–8.1)

## 2024-01-27 LAB — WET PREP, GENITAL
Clue Cells Wet Prep HPF POC: NONE SEEN
Sperm: NONE SEEN
Trich, Wet Prep: NONE SEEN
WBC, Wet Prep HPF POC: <10 (ref <10)
Yeast Wet Prep HPF POC: NONE SEEN

## 2024-01-27 LAB — RUPTURE OF MEMBRANE (ROM)PLUS: Rom Plus: NEGATIVE

## 2024-01-27 LAB — TYPE AND SCREEN
ABO/RH(D): O POS
Antibody Screen: NEGATIVE

## 2024-01-27 LAB — ABO/RH: ABO/RH(D): O POS

## 2024-01-27 MED ORDER — LIDOCAINE HCL (PF) 1 % IJ SOLN
INTRAMUSCULAR | Status: DC | PRN
  Administered 2024-01-27: 3 mL via SUBCUTANEOUS

## 2024-01-27 MED ORDER — OXYCODONE-ACETAMINOPHEN 5-325 MG PO TABS: 1.0000 | ORAL_TABLET | ORAL | Status: DC | PRN

## 2024-01-27 MED ORDER — FENTANYL-BUPIVACAINE-NACL 0.5-0.125-0.9 MG/250ML-% EP SOLN
12.0000 mL/h | EPIDURAL | Status: DC | PRN
  Administered 2024-01-27: 12 mL/h via EPIDURAL
  Filled 2024-01-27: qty 250

## 2024-01-27 MED ORDER — PHENYLEPHRINE 80 MCG/ML (10ML) SYRINGE FOR IV PUSH (FOR BLOOD PRESSURE SUPPORT): 80.0000 ug | PREFILLED_SYRINGE | INTRAVENOUS | Status: DC | PRN

## 2024-01-27 MED ORDER — ACETAMINOPHEN 325 MG PO TABS: 650.0000 mg | ORAL_TABLET | ORAL | Status: DC | PRN

## 2024-01-27 MED ORDER — EPHEDRINE 5 MG/ML INJ: 10.0000 mg | INTRAVENOUS | Status: DC | PRN

## 2024-01-27 MED ORDER — FENTANYL CITRATE (PF) 100 MCG/2ML IJ SOLN
50.0000 ug | INTRAMUSCULAR | Status: DC | PRN
  Filled 2024-01-27: qty 2

## 2024-01-27 MED ORDER — SOD CITRATE-CITRIC ACID 500-334 MG/5ML PO SOLN: 30.0000 mL | ORAL | Status: DC | PRN

## 2024-01-27 MED ORDER — MISOPROSTOL 50MCG HALF TABLET
50.0000 ug | ORAL_TABLET | ORAL | Status: DC | PRN
  Administered 2024-01-27: 50 ug via ORAL
  Filled 2024-01-27: qty 1

## 2024-01-27 MED ORDER — LACTATED RINGERS IV SOLN: 500.0000 mL | INTRAVENOUS | Status: DC | PRN

## 2024-01-27 MED ORDER — SODIUM CHLORIDE 0.9 % IV SOLN
INTRAVENOUS | Status: DC | PRN
  Administered 2024-01-27 (×2): 5 mL via EPIDURAL

## 2024-01-27 MED ORDER — OXYTOCIN-SODIUM CHLORIDE 30-0.9 UT/500ML-% IV SOLN
1.0000 m[IU]/min | INTRAVENOUS | Status: DC
  Administered 2024-01-27: 2 m[IU]/min via INTRAVENOUS

## 2024-01-27 MED ORDER — OXYTOCIN BOLUS FROM INFUSION: 333.0000 mL | Freq: Once | INTRAVENOUS | Status: DC

## 2024-01-27 MED ORDER — LIDOCAINE-EPINEPHRINE (PF) 1.5 %-1:200000 IJ SOLN
INTRAMUSCULAR | Status: DC | PRN
  Administered 2024-01-27: 3 mL via EPIDURAL

## 2024-01-27 MED ORDER — OXYCODONE-ACETAMINOPHEN 5-325 MG PO TABS: 2.0000 | ORAL_TABLET | ORAL | Status: DC | PRN

## 2024-01-27 MED ORDER — OXYTOCIN 10 UNIT/ML IJ SOLN: 10.0000 [IU] | Freq: Once | INTRAMUSCULAR | Status: DC

## 2024-01-27 MED ORDER — OXYTOCIN-SODIUM CHLORIDE 30-0.9 UT/500ML-% IV SOLN: 2.5000 [IU]/h | INTRAVENOUS | Status: DC

## 2024-01-27 MED ORDER — LACTATED RINGERS IV SOLN
500.0000 mL | Freq: Once | INTRAVENOUS | Status: DC
  Administered 2024-01-27: 500 mL via INTRAVENOUS

## 2024-01-27 MED ORDER — LACTATED RINGERS IV SOLN: INTRAVENOUS | Status: DC

## 2024-01-27 MED ORDER — LIDOCAINE HCL (PF) 1 % IJ SOLN: 30.0000 mL | INTRAMUSCULAR | Status: DC | PRN

## 2024-01-27 MED ORDER — DIPHENHYDRAMINE HCL 50 MG/ML IJ SOLN: 12.5000 mg | INTRAMUSCULAR | Status: DC | PRN

## 2024-01-27 MED ORDER — ONDANSETRON HCL 4 MG/2ML IJ SOLN
4.0000 mg | Freq: Four times a day (QID) | INTRAMUSCULAR | Status: DC | PRN
  Administered 2024-01-27: 4 mg via INTRAVENOUS
  Filled 2024-01-27: qty 2

## 2024-01-27 NOTE — Progress Notes (Signed)
 Labor Progress Note   ASSESSMENT/PLAN   Carolyn King 23 y.o.   G1P0  at [redacted]w[redacted]d here with premature ruptured of membranes, now prolonged rupture.  FWB:  - Fetal well being assessed: Cat I      GBS: - GBS Negative/-- (02/24 1616)    LABOR: - Now in active labor, doing well. - Pain Management: requesting epidural - Discussed options with patient and will continue titration of pitocin. Anesthesia at bedside preparing for epidural.  - Anticipate SVD   Labor Progress:   0200 PROM (Clr) 1015 2/50/ballotable, oral miso 1430 Pitocin started 1715 3/70/-3 1920 4/90/-2 2035 5/100/-1  SUBJECTIVE/OBJECTIVE   SUBJECTIVE:  Tearful and vocalizing audibly with contractions. Desires epidural at this time.   OBJECTIVE: Vital Signs: Patient Vitals for the past 12 hrs:  BP Temp Temp src Pulse Resp  01/27/24 1717 117/68 -- -- 74 --  01/27/24 1637 -- 97.9 F (36.6 C) Oral -- --  01/27/24 1533 -- 98.4 F (36.9 C) Oral -- --  01/27/24 1344 109/66 97.9 F (36.6 C) Oral 82 16  01/27/24 1158 -- 97.7 F (36.5 C) Oral -- --  01/27/24 0935 -- 97.9 F (36.6 C) Oral -- --    Last SVE:  Dilation: 4 Effacement (%): 70 Cervical Position: Posterior Station: -2 Presentation: Vertex Exam by:: Burk Hoctor CNM -  , Rupture Date: 01/27/24, Rupture Time: 0230,    FHR:   - Mode: External  - Baseline Rate (A): 130 bpm (fht)  -    - Characteristics (ie - accels, decels): Accelerations: 15 x 15  -    UTERINE ACTIVITY:   - Mode: Toco  - Contraction Frequency (min): 1.5-4 minutes Pitocin: 14 mU/min

## 2024-01-27 NOTE — Progress Notes (Signed)
 Labor Progress Note   ASSESSMENT/PLAN   Carolyn King 22 y.o.   G1P0  at [redacted]w[redacted]d here with premature rupture of membranes.  FWB:  - Fetal well being assessed: Cat I      GBS: - GBS Negative/-- (02/24 1616)    LABOR: - Now awaiting active labor, doing well. - Pain Management: utilizing birthing ball - Discussed options with patient and will continue to titrate pitocin. - Anticipate SVD   Labor Progress:  0200 PROM (Clr) 1015 2/50/ballotable, oral miso 1430 Pitocin started  SUBJECTIVE/OBJECTIVE   SUBJECTIVE:   Patient on birthing ball with family at bedside. Feeling some increased pressure and mild contractions.   OBJECTIVE: Vital Signs: Patient Vitals for the past 12 hrs:  BP Temp Temp src Pulse Resp Height Weight  01/27/24 1344 109/66 97.9 F (36.6 C) Oral 82 16 -- --  01/27/24 1158 -- 97.7 F (36.5 C) Oral -- -- -- --  01/27/24 0935 -- 97.9 F (36.6 C) Oral -- -- -- --  01/27/24 0704 116/72 98.2 F (36.8 C) Oral 92 15 5\' 2"  (1.575 m) 95.7 kg    Last SVE:  Dilation: 2 Effacement (%): 50 Exam by:: Lois Ostrom CNM -  , Rupture Date: 01/27/24, Rupture Time: 0230,    FHR:   - Mode: External  - Baseline Rate (A): 125 bpm  -    - Characteristics (ie - accels, decels): Accelerations: 15 x 15  -    UTERINE ACTIVITY:   - Mode: Toco  - Contraction Frequency (min): none noted minutes Pitocin: 2 mU/min

## 2024-01-27 NOTE — OB Triage Note (Signed)
 Patient is a G1P0 at [redacted]w[redacted]d who presents to unit c/o leakage of fluid that started around 0400. Reports +fetal movement, denies contractions and vaginal bleeding. External monitors applied and assessing. Initial FHT 130. Vital signs WDL.

## 2024-01-27 NOTE — Progress Notes (Signed)
 Labor Progress Note   ASSESSMENT/PLAN   Carolyn King 22 y.o.   G1P0  at [redacted]w[redacted]d here with premature rupture of membranes.  FWB:  - Fetal well being assessed: Cat I       GBS: - GBS Negative/-- (02/24 1616)    LABOR: - Now awaiting active labor, doing well. - Pain Management: position changes and breathing techniques - Discussed options with patient and will continue pitocin titration.  - Anticipate SVD   Labor Progress:   0200 PROM (Clr) 1015 2/50/ballotable, oral miso 1430 Pitocin started 1715 3/70/-3  SUBJECTIVE/OBJECTIVE   SUBJECTIVE:  Patient doing well, feeling some mild contractions, resting in bed.   OBJECTIVE: Vital Signs: Patient Vitals for the past 12 hrs:  BP Temp Temp src Pulse Resp Height Weight  01/27/24 1717 117/68 -- -- 74 -- -- --  01/27/24 1637 -- 97.9 F (36.6 C) Oral -- -- -- --  01/27/24 1533 -- 98.4 F (36.9 C) Oral -- -- -- --  01/27/24 1344 109/66 97.9 F (36.6 C) Oral 82 16 -- --  01/27/24 1158 -- 97.7 F (36.5 C) Oral -- -- -- --  01/27/24 0935 -- 97.9 F (36.6 C) Oral -- -- -- --  01/27/24 0704 116/72 98.2 F (36.8 C) Oral 92 15 5\' 2"  (1.575 m) 95.7 kg    Last SVE:  Dilation: 3 Effacement (%): 70 Cervical Position: Posterior Station: -3 Presentation: Vertex Exam by:: Calel Pisarski CNM -  , Rupture Date: 01/27/24, Rupture Time: 0230,    FHR:   - Mode: External  - Baseline Rate (A): 135 bpm (fht)  -    - Characteristics (ie - accels, decels): Accelerations: 10 x 10  -    UTERINE ACTIVITY:   - Mode: Toco  - Contraction Frequency (min): 1-6 minutes Pitocin: 10 mU/min

## 2024-01-27 NOTE — H&P (Signed)
 Carolyn King  History and Physical  ASSESSMENT AND PLAN   Carolyn King is a 23 y.o. G1P0 at [redacted]w[redacted]d with EDD: 02/05/2024, by Last Menstrual Period confirmed with a 12 week [place], admitted for augmentation in the setting of premature rupture of membranes. Her pregnancy is otherwise complicated by gestational hypertension.   PROM Augmentation - ROM+ negative initially, however, while waiting on results in triage, patient had significant gushes of clear fluid that were nitrazine positive and pooling positive with SSE. Based on this, she is considered grossly ruptured.  - Initial SVE 2/50/ballotable. Given unfavorable cervix, will start with oral misoprostol for ripening with plan to proceed to further augmentation with pitocin as appropriate. - Patient aware of pain management options. - Anticipate SVD.   Other Active Problems:  Gestational Hypertension - Diagnosed at [redacted]w[redacted]d, UP:C 277 at that time, down to 136 at 36 weeks. - Normotensive on admission, CBC and CMP pending. PC ratio deferred due to ruptured status. May consider with foley catheter if patient desires epidural.   Fetal Status: - cephalic presentation by Leopold's and BSUS - EFW: 7.5 lbs by Leopold's - Continuous fetal heart rate monitoring with augmentation - FHT currently cat I  Labs/Immunizations: Prenatal labs and studies: ABO, Rh: --/--/PENDING (03/17 8119) Antibody: PENDING (03/17 1478) Rubella: 8.03 (09/25 1532) Varicella: Reactive (09/25 1532)  RPR: Non Reactive (01/09 1018)  HBsAg: Negative (09/25 1532)  HepC: Non Reactive (09/25 1532) HIV: Non Reactive (01/09 1018)  GBS: Negative/-- (02/24 1616)   TDAP: given prenatally Flu: UTD RSV: no  Postpartum Plan: - Feeding: Breast Milk - Contraception: plans undecided - Prenatal Care Provider: AOB   HPI   Chief Complaint: Leaking fluid  Carolyn King is a 23 y.o. G1P0 at [redacted]w[redacted]d who presents for evaluation of leaking  amniotic fluid. She reports noting a gush of clear fluid that ran down her legs around 0200 this am. She has noted continued leakage of clear fluid since that time. She endorses normal fetal movement and mild, irregular contractions. She denies vaginal bleeding.    Pregnancy Complications Patient Active Problem List   Diagnosis Date Noted   PROM (premature rupture of membranes) 01/27/2024   Gestational hypertension 01/06/2024   Supervision of normal pregnancy 07/05/2023    Review of Systems A twelve point review of systems was negative except as stated in HPI.   HISTORY   Medications Medications Prior to Admission  Medication Sig Dispense Refill Last Dose/Taking   aspirin EC 81 MG tablet Take 2 tablets (162 mg total) by mouth daily. Swallow whole. 60 tablet 12    Prenatal 28-0.8 MG TABS Take 1 tablet by mouth every morning.       Allergies has no known allergies.   OB History OB History  Gravida Para Term Preterm AB Living  1 0 0 0 0 0  SAB IAB Ectopic Multiple Live Births  0 0 0 0 0    # Outcome Date GA Lbr Len/2nd Weight Sex Type Anes PTL Lv  1 Current             Past Medical History History reviewed. No pertinent past medical history.  Past Surgical History Past Surgical History:  Procedure Laterality Date   NO PAST SURGERIES      Social History  reports that she has never smoked. She has never used smokeless tobacco. She reports that she does not drink alcohol and does not use drugs.   Family History family history  includes Diabetes in her maternal grandmother; Healthy in her brother, father, maternal grandfather, mother, paternal grandfather, paternal grandmother, and sister.   PHYSICAL EXAM   Vitals:   01/27/24 0704 01/27/24 0935  BP: 116/72   Pulse: 92   Resp: 15   Temp: 98.2 F (36.8 C) 97.9 F (36.6 C)  TempSrc: Oral Oral  Weight: 95.7 kg   Height: 5\' 2"  (1.575 m)     Constitutional: No acute distress, well appearing, and well  nourished. Neurologic: She is alert and conversational.  Psychiatric: She has a normal mood and affect.  Musculoskeletal: Normal gait, grossly normal range of motion Cardiovascular: Normal rate.   Pulmonary/Chest: Normal work of breathing.  Gastrointestinal/Abdominal: Soft. Gravid. There is no tenderness.  Skin: Skin is warm and dry. No rash noted.  Genitourinary: Normal external female genitalia.  SVE:   Dilation: 2 Effacement (%): 50 Exam by:: Tiawana Forgy CNM SSE: positive for pooling  NST Interpretation Indication: PROM Baseline: 125 bpm Variability: moderate Accelerations: present Decelerations: variables at start of tracing, now Cat I Contractions: irregular Time noted:  See OBIX Impression: reactive Authenticated by: Lindalou Hose [name]   Attending Dr. Lonny Prude was immediately available for the care of the patient.   Lindalou Hose [name], CNM

## 2024-01-27 NOTE — Progress Notes (Signed)
 Labor Progress Note   ASSESSMENT/PLAN   Carolyn King 22 y.o.   G1P0  at [redacted]w[redacted]d here with premature rupture of membranes, now with prolonged rupture.  FWB:  - Fetal well being assessed: Cat II, was having early decelerations, prolonged deceleration at approximately 2302. CNM and RN at bedside, patient repositioned to left side, pitocin stopped, IV fluid bolus started. FHR returned to baseline of 135 still with minimal variability and early decels. Will continue to monitor     GBS: - GBS Negative/-- (02/24 1616)    LABOR: - Progressing in active labor, doing well. - Pain Management: comfortable with epidural - Discussed options with patient and will monitor FHR tracing and restart pitocin in 15-20 min if baseline remains stable .  - Anticipate SVD   Labor Progress: 0200 PROM (Clr) 1015 2/50/ballotable, oral miso 1430 Pitocin started 1715 3/70/-3 1920 4/90/-2 2035 5/100/-1 2305 9/100/0  SUBJECTIVE/OBJECTIVE   SUBJECTIVE:  Resting in bed, feeling very comfortable after epidural placement.    OBJECTIVE: Vital Signs: Patient Vitals for the past 12 hrs:  BP Temp Temp src Pulse Resp SpO2  01/27/24 2234 (!) 94/51 -- -- 70 -- 97 %  01/27/24 2205 (!) 90/52 97.9 F (36.6 C) Oral 81 -- 94 %  01/27/24 2155 (!) 95/52 -- -- 71 -- 95 %  01/27/24 2150 (!) 83/36 -- -- 82 -- 97 %  01/27/24 2145 -- -- -- -- -- 96 %  01/27/24 2140 -- -- -- -- -- 95 %  01/27/24 2135 111/64 -- -- 69 -- 95 %  01/27/24 2125 -- -- -- -- -- 95 %  01/27/24 2120 114/61 -- -- 75 -- 94 %  01/27/24 2115 116/63 -- -- 80 -- 94 %  01/27/24 2110 103/74 -- -- 76 -- 94 %  01/27/24 2105 111/76 -- -- 79 -- 94 %  01/27/24 2100 122/83 98.9 F (37.2 C) Oral (!) 103 -- 96 %  01/27/24 1920 134/88 -- -- 78 -- --  01/27/24 1717 117/68 -- -- 74 -- --  01/27/24 1637 -- 97.9 F (36.6 C) Oral -- -- --  01/27/24 1533 -- 98.4 F (36.9 C) Oral -- -- --  01/27/24 1344 109/66 97.9 F (36.6 C) Oral 82 16 --  01/27/24 1158 -- 97.7  F (36.5 C) Oral -- -- --    Last SVE:  Dilation: 9 Effacement (%): 100 Cervical Position: Posterior Station: 0 Presentation: Vertex Exam by:: Ladarrius Bogdanski, CNM -  , Rupture Date: 01/27/24, Rupture Time: 0230,    FHR:   - Mode: External  - Baseline Rate (A): 120 bpm  -    - Characteristics (ie - accels, decels): Accelerations: 15 x 15  -    UTERINE ACTIVITY:   - Mode: Toco  - Contraction Frequency (min): 1-3.5 minutes Pitocin: 22 mU/min prior to stopping for prolonged FHR deceleration

## 2024-01-27 NOTE — Anesthesia Procedure Notes (Addendum)
 Epidural Patient location during procedure: OB Start time: 01/27/2024 8:50 PM End time: 01/27/2024 8:55 PM  Staffing Anesthesiologist: Lenard Simmer, MD Performed: anesthesiologist   Preanesthetic Checklist Completed: patient identified, IV checked, site marked, risks and benefits discussed, surgical consent, monitors and equipment checked, pre-op evaluation and timeout performed  Epidural Patient position: sitting Prep: ChloraPrep Patient monitoring: heart rate, continuous pulse ox and blood pressure Approach: midline Location: L3-L4 Injection technique: LOR saline  Needle:  Needle type: Tuohy  Needle gauge: 17 G Needle length: 9 cm Needle insertion depth: 5.5 cm Catheter type: closed end flexible Catheter size: 19 Gauge Catheter at skin depth: 10.5 cm Test dose: negative and 1.5% lidocaine with Epi 1:200 K  Assessment Sensory level: T10 Events: blood not aspirated, no cerebrospinal fluid, injection not painful, no injection resistance, no paresthesia and negative IV test  Additional Notes 1st attempt Pt. Evaluated and documentation done after procedure finished. Patient identified. Risks/Benefits/Options discussed with patient including but not limited to bleeding, infection, nerve damage, paralysis, failed block, incomplete pain control, headache, blood pressure changes, nausea, vomiting, reactions to medication both or allergic, itching and postpartum back pain. Confirmed with bedside nurse the patient's most recent platelet count. Confirmed with patient that they are not currently taking any anticoagulation, have any bleeding history or any family history of bleeding disorders. Patient expressed understanding and wished to proceed. All questions were answered. Sterile technique was used throughout the entire procedure. Please see nursing notes for vital signs. Test dose was given through epidural catheter and negative prior to continuing to dose epidural or start infusion.  Warning signs of high block given to the patient including shortness of breath, tingling/numbness in hands, complete motor block, or any concerning symptoms with instructions to call for help. Patient was given instructions on fall risk and not to get out of bed. All questions and concerns addressed with instructions to call with any issues or inadequate analgesia.    Patient tolerated the insertion well without immediate complications.Reason for block:procedure for pain

## 2024-01-27 NOTE — Anesthesia Preprocedure Evaluation (Signed)
 Anesthesia Evaluation  Patient identified by MRN, date of birth, ID band Patient awake    Reviewed: Allergy & Precautions, H&P , NPO status , Patient's Chart, lab work & pertinent test results, reviewed documented beta blocker date and time   History of Anesthesia Complications Negative for: history of anesthetic complications  Airway Mallampati: III  TM Distance: >3 FB Neck ROM: full    Dental no notable dental hx.    Pulmonary neg pulmonary ROS   Pulmonary exam normal breath sounds clear to auscultation       Cardiovascular Exercise Tolerance: Good hypertension (gestational), (-) angina Normal cardiovascular exam(-) dysrhythmias (-) Valvular Problems/Murmurs Rhythm:regular Rate:Normal     Neuro/Psych negative neurological ROS  negative psych ROS   GI/Hepatic Neg liver ROS,GERD  ,,  Endo/Other  negative endocrine ROS    Renal/GU negative Renal ROS  negative genitourinary   Musculoskeletal   Abdominal   Peds  Hematology negative hematology ROS (+)   Anesthesia Other Findings History reviewed. No pertinent past medical history.   Reproductive/Obstetrics (+) Pregnancy                             Anesthesia Physical Anesthesia Plan  ASA: 2  Anesthesia Plan: Epidural   Post-op Pain Management:    Induction:   PONV Risk Score and Plan:   Airway Management Planned:   Additional Equipment:   Intra-op Plan:   Post-operative Plan:   Informed Consent: I have reviewed the patients History and Physical, chart, labs and discussed the procedure including the risks, benefits and alternatives for the proposed anesthesia with the patient or authorized representative who has indicated his/her understanding and acceptance.     Dental Advisory Given  Plan Discussed with: Anesthesiologist, CRNA and Surgeon  Anesthesia Plan Comments:         Anesthesia Quick Evaluation

## 2024-01-28 ENCOUNTER — Encounter: Payer: Self-pay | Admitting: Obstetrics

## 2024-01-28 DIAGNOSIS — O134 Gestational [pregnancy-induced] hypertension without significant proteinuria, complicating childbirth: Secondary | ICD-10-CM

## 2024-01-28 LAB — CBC
HCT: 30.1 % — ABNORMAL LOW (ref 36.0–46.0)
Hemoglobin: 10.2 g/dL — ABNORMAL LOW (ref 12.0–15.0)
MCH: 29.7 pg (ref 26.0–34.0)
MCHC: 33.9 g/dL (ref 30.0–36.0)
MCV: 87.5 fL (ref 80.0–100.0)
Platelets: 309 10*3/uL (ref 150–400)
RBC: 3.44 MIL/uL — ABNORMAL LOW (ref 3.87–5.11)
RDW: 13.2 % (ref 11.5–15.5)
WBC: 16 10*3/uL — ABNORMAL HIGH (ref 4.0–10.5)
nRBC: 0 % (ref 0.0–0.2)

## 2024-01-28 LAB — RPR: RPR Ser Ql: NONREACTIVE

## 2024-01-28 MED ORDER — OXYTOCIN-SODIUM CHLORIDE 30-0.9 UT/500ML-% IV SOLN: 2.5000 [IU]/h | INTRAVENOUS | Status: DC | PRN

## 2024-01-28 MED ORDER — WITCH HAZEL-GLYCERIN EX PADS
MEDICATED_PAD | CUTANEOUS | Status: DC | PRN
  Filled 2024-01-28 (×2): qty 100

## 2024-01-28 MED ORDER — IBUPROFEN 600 MG PO TABS
600.0000 mg | ORAL_TABLET | Freq: Four times a day (QID) | ORAL | Status: DC
  Administered 2024-01-28 – 2024-01-29 (×5): 600 mg via ORAL
  Filled 2024-01-28 (×5): qty 1

## 2024-01-28 MED ORDER — TRANEXAMIC ACID-NACL 1000-0.7 MG/100ML-% IV SOLN
INTRAVENOUS | Status: AC
Start: 2024-01-28 — End: 2024-01-28
  Administered 2024-01-28: 10 mg via INTRAVENOUS
  Filled 2024-01-28: qty 100

## 2024-01-28 MED ORDER — FERROUS SULFATE 325 (65 FE) MG PO TABS
325.0000 mg | ORAL_TABLET | Freq: Every day | ORAL | Status: DC
  Administered 2024-01-29: 325 mg via ORAL
  Filled 2024-01-28: qty 1

## 2024-01-28 MED ORDER — DIPHENHYDRAMINE HCL 25 MG PO CAPS: 25.0000 mg | ORAL_CAPSULE | Freq: Four times a day (QID) | ORAL | Status: DC | PRN

## 2024-01-28 MED ORDER — DOCUSATE SODIUM 100 MG PO CAPS
100.0000 mg | ORAL_CAPSULE | Freq: Two times a day (BID) | ORAL | Status: DC
  Administered 2024-01-28 – 2024-01-29 (×2): 100 mg via ORAL
  Filled 2024-01-28 (×2): qty 1

## 2024-01-28 MED ORDER — SIMETHICONE 80 MG PO CHEW: 80.0000 mg | CHEWABLE_TABLET | ORAL | Status: DC | PRN

## 2024-01-28 MED ORDER — BENZOCAINE-MENTHOL 20-0.5 % EX AERO
1.0000 | INHALATION_SPRAY | CUTANEOUS | Status: DC | PRN
  Filled 2024-01-28 (×2): qty 56

## 2024-01-28 MED ORDER — METHYLERGONOVINE MALEATE 0.2 MG/ML IJ SOLN
INTRAMUSCULAR | Status: AC
  Filled 2024-01-28: qty 1

## 2024-01-28 MED ORDER — PRENATAL MULTIVITAMIN CH
1.0000 | ORAL_TABLET | Freq: Every day | ORAL | Status: DC
  Administered 2024-01-28 – 2024-01-29 (×2): 1 via ORAL
  Filled 2024-01-28 (×2): qty 1

## 2024-01-28 MED ORDER — ACETAMINOPHEN 325 MG PO TABS: 650.0000 mg | ORAL_TABLET | ORAL | Status: DC | PRN

## 2024-01-28 MED ORDER — COCONUT OIL OIL: 1.0000 | TOPICAL_OIL | Status: DC | PRN

## 2024-01-28 NOTE — Discharge Summary (Signed)
 Postpartum Discharge Summary  Date of Service updated***     Patient Name: Carolyn King DOB: 07-02-2001 MRN: [id number]  Date of admission: 01/27/2024 Delivery date:01/28/2024 Delivering provider: Amir Fick, Lindalou Hose Date of discharge: 01/28/2024  Admitting diagnosis: PROM (premature rupture of membranes) [O42.90] Intrauterine pregnancy: [redacted]w[redacted]d     Secondary diagnosis:  Principal Problem:   PROM (premature rupture of membranes) Active Problems:   Supervision of normal pregnancy   Gestational hypertension    Discharge diagnosis: {DX.:23714}                                              Post partum procedures:{Postpartum procedures:23558} Augmentation: Pitocin and Cytotec Complications: None  Hospital course: Induction of Labor With Vaginal Delivery   23 y.o. yo G1P0 at [redacted]w[redacted]d was admitted to the hospital 01/27/2024 for induction of labor.  Indication for induction: PROM.  Patient had an uncomplicated labor course. Membrane Rupture Time/Date: 2:30 AM,01/27/2024  Delivery Method:Vaginal, Spontaneous Operative Delivery:N/A Episiotomy: None Lacerations:  Vaginal Details of delivery can be found in separate delivery note.  Patient had a postpartum course complicated by***. Patient is discharged home 01/28/24.  Newborn Data: Birth date:01/28/2024 Birth time:2:54 AM Gender:Female Living status:  Apgars:8 ,9  Weight:   Magnesium Sulfate received: No BMZ received: No Rhophylac:N/A MMR:N/A T-DaP:Given prenatally Flu: Yes RSV Vaccine received: No Transfusion:{Transfusion received:30440034} Immunizations administered: Immunization History  Administered Date(s) Administered   DTaP 12/17/2003   Dtap, Unspecified 03/14/2004, 06/05/2004, 04/10/2005, 08/09/2008   HIB, Unspecified 12/17/2003, 03/14/2004, 06/05/2004, 01/15/2005   HPV 9-valent 06/29/2016, 12/01/2020   Hep A, Unspecified 11/23/2005, 04/23/2007   Hep B, Unspecified 25-Jun-2003, 11/16/2003, 04/10/2005   Influenza Nasal  09/19/2009   Influenza, Seasonal, Injecte, Preservative Fre 12/18/2010, 12/05/2023   Influenza,Quad,Nasal, Live 09/29/2013   Influenza-Unspecified 01/15/2005, 09/08/2007   MMR 01/15/2005, 08/09/2008   Meningococcal Conjugate 06/29/2016, 12/01/2020   PFIZER(Purple Top)SARS-COV-2 Vaccination 06/28/2020   Pneumococcal Conjugate PCV 7 12/17/2003, 06/05/2004   Polio, Unspecified 03/14/2004, 06/05/2004, 04/10/2005, 08/09/2008   Tdap 06/29/2016, 01/06/2024   Varicella 10/13/2004, 09/29/2013    Physical exam  Vitals:   01/27/24 2234 01/27/24 2321 01/28/24 0020 01/28/24 0127  BP: (!) 94/51 116/77 113/67   Pulse: 70 68 68   Resp:      Temp:   98.2 F (36.8 C) 97.9 F (36.6 C)  TempSrc:   Oral Oral  SpO2: 97% 97% 99%   Weight:      Height:       General: {Exam; general:21111117} Lochia: {Desc; appropriate/inappropriate:30686::"appropriate"} Uterine Fundus: {Desc; firm/soft:30687} Incision: {Exam; incision:21111123} DVT Evaluation: {Exam; dvt:2111122} Labs: Lab Results  Component Value Date   WBC 8.2 01/27/2024   HGB 11.5 (L) 01/27/2024   HCT 33.4 (L) 01/27/2024   MCV 85.0 01/27/2024   PLT 342 01/27/2024      Latest Ref Rng & Units 01/27/2024    9:28 AM  CMP  Glucose 70 - 99 mg/dL 87   BUN 6 - 20 mg/dL 8   Creatinine 4.74 - 2.59 mg/dL 5.63   Sodium 875 - 643 mmol/L 139   Potassium 3.5 - 5.1 mmol/L 4.0   Chloride 98 - 111 mmol/L 110   CO2 22 - 32 mmol/L 22   Calcium 8.9 - 10.3 mg/dL 8.7   Total Protein 6.5 - 8.1 g/dL 6.2   Total Bilirubin 0.0 - 1.2 mg/dL 0.6   Alkaline  Phos 38 - 126 U/L 125   AST 15 - 41 U/L 22   ALT 0 - 44 U/L 10    Edinburgh Score:    07/05/2023    1:30 PM  Edinburgh Postnatal Depression Scale Screening Tool  I have been able to laugh and see the funny side of things. 0  I have looked forward with enjoyment to things. 0  I have blamed myself unnecessarily when things went wrong. 0  I have been anxious or worried for no good reason. 0  I have  felt scared or panicky for no good reason. 0  Things have been getting on top of me. 1  I have been so unhappy that I have had difficulty sleeping. 0  I have felt sad or miserable. 0  I have been so unhappy that I have been crying. 0  The thought of harming myself has occurred to me. 0  Edinburgh Postnatal Depression Scale Total 1      After visit meds:  Allergies as of 01/28/2024   No Known Allergies   Med Rec must be completed prior to using this [facility]***        Discharge home in stable condition Infant Feeding: Breast Infant Disposition:{CHL IP OB HOME WITH ZDGUYQ:03474} Discharge instruction: per After Visit Summary and Postpartum booklet. Activity: Advance as tolerated. Pelvic rest for 6 weeks.  Diet: routine diet Anticipated Birth Control: {Birth Control:23956} Postpartum Appointment:6 weeks Additional Postpartum F/U: Postpartum Depression checkup Future Appointments:No future appointments. Follow up Visit:  Follow-up Information     Carey Lafon, Lindalou Hose, CNM. Schedule an appointment as soon as possible for a visit.   Specialty: Obstetrics and Gynecology Why: In two weeks for telehealth visit and in 6 weeks for in-person PP visit. Contact information: [address] [place] [address] [phone]                     01/28/2024 Lindalou Hose Heiley Shaikh, CNM

## 2024-01-28 NOTE — Progress Notes (Signed)
 Labor Progress Note   ASSESSMENT/PLAN   Carolyn King 23 y.o.   G1P0  at [redacted]w[redacted]d here with  premature rupture of membranes, now with prolonged rupture.  FWB:  - Fetal well being assessed: Cat II, periods of minimal variability with early decelerations, repositioned to sitting high thrones.   GBS: - GBS Negative/-- (02/24 1616)    LABOR: - In transition, doing well. - Pain Management: epidural - Attempted to reduce left-sided cervical lip with patient pushing. Lip persistent at this time. Will continue with pitocin and recheck when patient is feeling more pressure.  - Anticipate SVD   Labor Progress: 0200 PROM (Clr) 1015 2/50/ballotable, oral miso 1430 Pitocin started 1715 3/70/-3 1920 4/90/-2 2035 5/100/-1 2305 9/100/0  0030 9.5/100/+1  SUBJECTIVE/OBJECTIVE   SUBJECTIVE:  Feeling intermittent rectal pressure.   OBJECTIVE: Vital Signs: Patient Vitals for the past 12 hrs:  BP Temp Temp src Pulse Resp SpO2  01/27/24 2321 116/77 -- -- 68 -- 97 %  01/27/24 2234 (!) 94/51 -- -- 70 -- 97 %  01/27/24 2205 (!) 90/52 97.9 F (36.6 C) Oral 81 -- 94 %  01/27/24 2155 (!) 95/52 -- -- 71 -- 95 %  01/27/24 2150 (!) 83/36 -- -- 82 -- 97 %  01/27/24 2145 -- -- -- -- -- 96 %  01/27/24 2140 -- -- -- -- -- 95 %  01/27/24 2135 111/64 -- -- 69 -- 95 %  01/27/24 2125 -- -- -- -- -- 95 %  01/27/24 2120 114/61 -- -- 75 -- 94 %  01/27/24 2115 116/63 -- -- 80 -- 94 %  01/27/24 2110 103/74 -- -- 76 -- 94 %  01/27/24 2105 111/76 -- -- 79 -- 94 %  01/27/24 2100 122/83 98.9 F (37.2 C) Oral (!) 103 -- 96 %  01/27/24 1920 134/88 -- -- 78 -- --  01/27/24 1717 117/68 -- -- 74 -- --  01/27/24 1637 -- 97.9 F (36.6 C) Oral -- -- --  01/27/24 1533 -- 98.4 F (36.9 C) Oral -- -- --  01/27/24 1344 109/66 97.9 F (36.6 C) Oral 82 16 --    Last SVE:  Dilation: 9 Effacement (%): 100 Cervical Position: Posterior Station: 0 Presentation: Vertex Exam by:: Muhammad Vacca, CNM -  , Rupture Date:  01/27/24, Rupture Time: 0230,    FHR:   - Mode: External  - Baseline Rate (A): 125 bpm  -    - Characteristics (ie - accels, decels): Accelerations: 15 x 15  -    UTERINE ACTIVITY:   - Mode: Toco  - Contraction Frequency (min): 2-3 minutes Pitocin: 10 mU/min

## 2024-01-28 NOTE — Lactation Note (Signed)
 This note was copied from a baby's chart. Lactation Consultation Note  Patient Name: Carolyn King [id number] Date: 01/28/2024 Age:21 hours Reason for consult: Initial assessment;Primapara;Early term 37-38.6wks;Breastfeeding assistance   Maternal Data This is mom's 1st baby, SVD. Mom with history of gestational hypertension.  On initial visit assisted first time mom with breastfeeding.  Has patient been taught Hand Expression?: Yes Does the patient have breastfeeding experience prior to this delivery?: No  Feeding Mother's Current Feeding Choice: Breast Milk  Provided mom with tips and strategies to maximize position and latch technique. Baby latched well in football hold with good amount of swallows also noted by mother.  LATCH Score Latch: Grasps breast easily, tongue down, lips flanged, rhythmical sucking.  Audible Swallowing: Spontaneous and intermittent  Type of Nipple: Everted at rest and after stimulation  Comfort (Breast/Nipple): Soft / non-tender  Hold (Positioning): Assistance needed to correctly position infant at breast and maintain latch. (Assisted mom with positioning baby at left breast in football hold.)  LATCH Score: 9  Interventions Interventions: Breast feeding basics reviewed;Assisted with latch;Breast massage;Hand express;Breast compression;Adjust position;Support pillows;Position options;Education  Discharge Pump: Manual;Personal  Consult Status Consult Status: Follow-up Date: 01/29/24 Follow-up type: In-patient  Update provided to care nurse.  Carolyn King 01/28/2024, 10:38 PM

## 2024-01-28 NOTE — Progress Notes (Signed)
 Post Partum Day 0 Subjective: Carolyn King is feeling well overall. She is ambulating, voiding, and tolerating POs without difficulty. Her pain is well-controlled and her bleeding is WNL. Her mood is stable. Breastfeeding is going well.   Objective: Blood pressure 98/60, pulse 86, temperature 99 F (37.2 C), temperature source Oral, resp. rate 17, height 5\' 2"  (1.575 m), weight 95.7 kg, last menstrual period 05/01/2023, SpO2 99%, unknown if currently breastfeeding.  Physical Exam:  General: alert, cooperative, and appears stated age 89: appropriate Uterine Fundus: firm Perineum: healing well DVT Evaluation: No evidence of DVT seen on physical exam.  Recent Labs    01/27/24 0928 01/28/24 0625  HGB 11.5* 10.2*  HCT 33.4* 30.1*    Assessment/Plan: Plan for discharge tomorrow Breastfeeding Lactation assistance as needed Undecided about contraception PO iron Routine postpartum care    LOS: 1 day   Glenetta Borg, CNM 01/28/2024, 12:18 PM

## 2024-01-29 NOTE — Anesthesia Postprocedure Evaluation (Signed)
 Anesthesia Post Note  Patient: Carolyn King  Procedure(s) Performed: AN AD HOC LABOR EPIDURAL  Patient location during evaluation: Mother Baby Anesthesia Type: Epidural Level of consciousness: oriented and awake and alert Pain management: pain level controlled Vital Signs Assessment: post-procedure vital signs reviewed and stable Respiratory status: spontaneous breathing and respiratory function stable Cardiovascular status: blood pressure returned to baseline and stable Postop Assessment: no headache, no backache, no apparent nausea or vomiting and able to ambulate Anesthetic complications: no   No notable events documented.   Last Vitals:  Vitals:   01/28/24 2318 01/29/24 0747  BP: 104/63 103/74  Pulse: 91 71  Resp: 18 18  Temp: 36.6 C 36.9 C  SpO2: 95% 98%    Last Pain:  Vitals:   01/29/24 0747  TempSrc: Oral  PainSc:                  Starling Manns

## 2024-01-29 NOTE — Progress Notes (Signed)
Patient discharged home with infant. Discharge instructions and prescriptions given and reviewed with patient. Patient verbalized understanding. Escorted out by auxillary.  

## 2024-01-29 NOTE — Lactation Note (Signed)
 This note was copied from a baby's chart. Lactation Consultation Note  Patient Name: Girl Mckinnley Cottier [id number] Date: 01/29/2024 Age:21 hours Reason for consult: Follow-up assessment;Primapara;Early term 37-38.6wks;Other (Comment) (Discharge education)   Maternal Data Follow up assessment and discharge education.  Patient stated that feedings have been going good.  Infant with a 8% weight loss.   Feeding Mother's Current Feeding Choice: Breast Milk  Interventions Interventions: Breast feeding basics reviewed;Education;CDC milk storage guidelines  LC stated to patient that she would like to observe a feeding prior to discharge.  Infant has appropriate wet/dirty diapers at 30hrs of life.   Discharge Discharge Education: Engorgement and breast care;Warning signs for feeding baby;Outpatient recommendation  Education on engorgement prevention/treatment was discussed as well as breastmilk storage guidelines.  LC provided patient with a handout on breastmilk storage guidelines from [facility]. [facility] outpatient lactation services phone number written on the white board in the room.  Patient verbalized understanding.  LC also provided education from the postpartum book about warning signs to look for in a poor feeding.    Consult Status Consult Status: Complete Follow-up type: Call as needed    Yvette Rack Ivorie Uplinger 01/29/2024, 12:27 PM

## 2024-01-29 NOTE — Lactation Note (Signed)
 This note was copied from a baby's chart. Lactation Consultation Note  Patient Name: Girl Luddie Boghosian [id number] Date: 01/29/2024 Age:21 hours Reason for consult: Mother's request;Primapara;Breastfeeding assistance  Feeding Mother's Current Feeding Choice: Breast Milk  Lactation assisted patient w/ a feeding at the breast.  FOB very hands on with mom/baby and was able to position infant at the breast, and compress breast to help with a feeding. Several attempts were made at the breast, but infant not interested at the time.    LC recommended that family remove the pants from infant. LC taught FOB how to help position infant in football hold for a feeding.  Infant attempted a couple of times and did latch.  The feeding was non-nutritive after a couple of minutes and infant went to sleep.   Interventions Interventions: Assisted with latch;Hand express;Expressed milk;Education  Discharge Discharge Education: Engorgement and breast care;Warning signs for feeding baby;Outpatient recommendation  Consult Status Consult Status: Complete Follow-up type: Call as needed    Yvette Rack Draden Cottingham 01/29/2024, 12:31 PM

## 2024-01-29 NOTE — Discharge Instructions (Signed)

## 2024-02-06 ENCOUNTER — Ambulatory Visit

## 2024-02-06 VITALS — BP 111/75 | HR 74 | Ht 62.0 in | Wt 204.0 lb

## 2024-02-06 DIAGNOSIS — Z0130 Encounter for examination of blood pressure without abnormal findings: Secondary | ICD-10-CM

## 2024-02-06 NOTE — Progress Notes (Signed)
    NURSE VISIT NOTE  Subjective:    Patient ID: Carolyn King, female    DOB: September 11, 2001, 23 y.o.   MRN: [id number]  HPI  Patient is a 23 y.o. G59P1001 female who presents for BP check per order from [facility], [place].    BP Readings from Last 3 Encounters:  02/06/24 111/75  01/29/24 103/74  01/20/24 102/69   Pulse Readings from Last 3 Encounters:  02/06/24 74  01/29/24 71  01/20/24 80    Objective:    BP 111/75   Pulse 74   Wt 204 lb (92.5 kg)   BMI 37.31 kg/m   Assessment:   1. Blood pressure check      Plan:    Return to clinic PRN Has PPV scheduled.  Patient verbalized understanding of instructions.   Loney Laurence, CMA

## 2024-02-17 ENCOUNTER — Telehealth (INDEPENDENT_AMBULATORY_CARE_PROVIDER_SITE_OTHER)

## 2024-02-17 NOTE — Progress Notes (Signed)
   Virtual Visit via Video Note   I connected withNAME@  on 02/17/24 4:40 PM by a video enabled telemedicine application and verified that I am speaking with the correct person using two identifiers.   Location: Patient: home Provider: [facility]   I discussed the limitations of evaluation and management by telemedicine and the availability of in person appointments. The patient expressed understanding and agreed to proceed. Subjective:    Subjective  Carolyn King is a 23 y.o. female who presents for a postpartum visit. She is2 weeks  postpartum following a spontaneous vaginal delivery. I have fully reviewed the prenatal and intrapartum course. The delivery was at [redacted]w[redacted]d gestational age.  Anesthesia: epidural.    Postpartum course has been normal. She has no pain and no other concerns. Baby's course has been normal. Baby is feeding by  both breast and bottle - formula . Bleeding thin lochia. Bowel function is normal. Bladder function is normal.  .  Contraception method: considering Mirena IUD but is undecided. Postpartum depression screening: EPDS 4     Review of Systems Pertinent positives noted in HPI. Remainder of comprehensive ROS otherwise negative.     Objective:      Objective  There were no vitals taken for this visit.  General:  alert, cooperative, and no distress        Assessment:    1. Encounter for screening for maternal depression - Screening Negative today. Will rescreen at 6 week postpartum visit.     2. Postpartum state - Overall doing well. Continue routine postpartum home care.    3. Lactating mother - breast and formula feeding  4. Contraception - Considering Mirena IUD   Plan:      Plan - Follow up for 6 week PP in-person visit, already scheduled.    I discussed the assessment and treatment plan with the patient. The patient was provided an opportunity to ask questions and all were answered. The patient agreed with the plan and  demonstrated an understanding of the instructions.   The patient was advised to call back or seek an in-person evaluation if the symptoms worsen or if the condition fails to improve as anticipated.   I provided 15 minutes of non-face-to-face time during this encounter.     Lindalou Hose Soledad Budreau, CNM

## 2024-03-17 ENCOUNTER — Telehealth: Payer: Self-pay

## 2024-03-17 ENCOUNTER — Ambulatory Visit

## 2024-03-17 NOTE — Telephone Encounter (Signed)
 Reached out to pt to reschedule 6 week pp visit that was scheduled on 03/17/2024 at 3:35 with S. Free.  Left message for pt to call back.

## 2024-03-18 NOTE — Telephone Encounter (Signed)
 Reached out to pt (2x) to reschedule 6 week pp visit that was scheduled on 03/17/2024 at 3:35 with S. Free.  Left message for pt to call back.  Will send a MyChart letter to pt.
